# Patient Record
Sex: Female | Born: 1935 | Race: White | Hispanic: No | Smoking: Former smoker
Health system: Southern US, Community
[De-identification: ages and names within clinical notes are randomized; demographics above are authoritative.]

## PROBLEM LIST (undated history)

## (undated) DIAGNOSIS — K5010 Crohn's disease of large intestine without complications: Secondary | ICD-10-CM

## (undated) DIAGNOSIS — K219 Gastro-esophageal reflux disease without esophagitis: Secondary | ICD-10-CM

## (undated) DIAGNOSIS — D126 Benign neoplasm of colon, unspecified: Secondary | ICD-10-CM

## (undated) DIAGNOSIS — I10 Essential (primary) hypertension: Secondary | ICD-10-CM

## (undated) DIAGNOSIS — T7840XA Allergy, unspecified, initial encounter: Secondary | ICD-10-CM

## (undated) HISTORY — PX: VAGINAL HYSTERECTOMY: SUR661

## (undated) HISTORY — PX: APPENDECTOMY: SHX54

## (undated) HISTORY — PX: FOOT SURGERY: SHX648

## (undated) HISTORY — DX: Gastro-esophageal reflux disease without esophagitis: K21.9

## (undated) HISTORY — DX: Essential (primary) hypertension: I10

## (undated) HISTORY — DX: Benign neoplasm of colon, unspecified: D12.6

## (undated) HISTORY — PX: HAND SURGERY: SHX662

## (undated) HISTORY — DX: Allergy, unspecified, initial encounter: T78.40XA

## (undated) HISTORY — DX: Crohn's disease of large intestine without complications: K50.10

---

## 1998-01-28 ENCOUNTER — Ambulatory Visit (HOSPITAL_COMMUNITY): Admission: RE | Admit: 1998-01-28 | Discharge: 1998-01-28 | Payer: Self-pay

## 1998-02-03 ENCOUNTER — Ambulatory Visit (HOSPITAL_COMMUNITY): Admission: RE | Admit: 1998-02-03 | Discharge: 1998-02-03 | Payer: Self-pay

## 1999-03-06 DIAGNOSIS — D126 Benign neoplasm of colon, unspecified: Secondary | ICD-10-CM

## 1999-03-06 HISTORY — DX: Benign neoplasm of colon, unspecified: D12.6

## 1999-03-10 ENCOUNTER — Encounter (INDEPENDENT_AMBULATORY_CARE_PROVIDER_SITE_OTHER): Payer: Self-pay

## 1999-03-10 ENCOUNTER — Other Ambulatory Visit: Admission: RE | Admit: 1999-03-10 | Discharge: 1999-03-10 | Payer: Self-pay | Admitting: Gastroenterology

## 1999-03-10 ENCOUNTER — Encounter: Payer: Self-pay | Admitting: Gastroenterology

## 1999-03-20 ENCOUNTER — Ambulatory Visit (HOSPITAL_COMMUNITY): Admission: RE | Admit: 1999-03-20 | Discharge: 1999-03-20 | Payer: Self-pay | Admitting: Internal Medicine

## 1999-03-20 ENCOUNTER — Encounter (HOSPITAL_BASED_OUTPATIENT_CLINIC_OR_DEPARTMENT_OTHER): Payer: Self-pay | Admitting: Internal Medicine

## 1999-04-02 ENCOUNTER — Encounter: Payer: Self-pay | Admitting: General Surgery

## 1999-04-06 HISTORY — PX: HEMICOLECTOMY: SHX854

## 1999-04-06 HISTORY — PX: CHOLECYSTECTOMY: SHX55

## 1999-04-07 ENCOUNTER — Encounter: Payer: Self-pay | Admitting: Gastroenterology

## 1999-04-07 ENCOUNTER — Inpatient Hospital Stay (HOSPITAL_COMMUNITY): Admission: RE | Admit: 1999-04-07 | Discharge: 1999-04-14 | Payer: Self-pay | Admitting: General Surgery

## 1999-04-07 ENCOUNTER — Encounter (INDEPENDENT_AMBULATORY_CARE_PROVIDER_SITE_OTHER): Payer: Self-pay | Admitting: *Deleted

## 1999-04-13 ENCOUNTER — Encounter: Payer: Self-pay | Admitting: General Surgery

## 2000-07-07 ENCOUNTER — Ambulatory Visit (HOSPITAL_COMMUNITY): Admission: RE | Admit: 2000-07-07 | Discharge: 2000-07-07 | Payer: Self-pay | Admitting: Internal Medicine

## 2000-07-07 ENCOUNTER — Encounter (HOSPITAL_BASED_OUTPATIENT_CLINIC_OR_DEPARTMENT_OTHER): Payer: Self-pay | Admitting: Internal Medicine

## 2001-05-23 ENCOUNTER — Encounter: Payer: Self-pay | Admitting: Gastroenterology

## 2001-07-25 ENCOUNTER — Encounter (HOSPITAL_BASED_OUTPATIENT_CLINIC_OR_DEPARTMENT_OTHER): Payer: Self-pay | Admitting: Internal Medicine

## 2001-07-25 ENCOUNTER — Ambulatory Visit (HOSPITAL_COMMUNITY): Admission: RE | Admit: 2001-07-25 | Discharge: 2001-07-25 | Payer: Self-pay | Admitting: Internal Medicine

## 2002-07-27 ENCOUNTER — Encounter (HOSPITAL_BASED_OUTPATIENT_CLINIC_OR_DEPARTMENT_OTHER): Payer: Self-pay | Admitting: Internal Medicine

## 2002-07-27 ENCOUNTER — Ambulatory Visit (HOSPITAL_COMMUNITY): Admission: RE | Admit: 2002-07-27 | Discharge: 2002-07-27 | Payer: Self-pay | Admitting: Internal Medicine

## 2003-09-23 ENCOUNTER — Encounter: Payer: Self-pay | Admitting: Gastroenterology

## 2003-10-04 ENCOUNTER — Ambulatory Visit (HOSPITAL_COMMUNITY): Admission: RE | Admit: 2003-10-04 | Discharge: 2003-10-04 | Payer: Self-pay | Admitting: Internal Medicine

## 2005-01-25 ENCOUNTER — Ambulatory Visit (HOSPITAL_COMMUNITY): Admission: RE | Admit: 2005-01-25 | Discharge: 2005-01-25 | Payer: Self-pay | Admitting: Internal Medicine

## 2005-09-30 ENCOUNTER — Ambulatory Visit: Payer: Self-pay | Admitting: Gastroenterology

## 2005-10-11 ENCOUNTER — Ambulatory Visit: Payer: Self-pay | Admitting: Gastroenterology

## 2005-10-11 ENCOUNTER — Encounter (INDEPENDENT_AMBULATORY_CARE_PROVIDER_SITE_OTHER): Payer: Self-pay | Admitting: Specialist

## 2006-03-27 ENCOUNTER — Emergency Department (HOSPITAL_COMMUNITY): Admission: EM | Admit: 2006-03-27 | Discharge: 2006-03-27 | Payer: Self-pay | Admitting: Emergency Medicine

## 2006-03-31 ENCOUNTER — Ambulatory Visit: Payer: Self-pay | Admitting: Internal Medicine

## 2006-06-03 ENCOUNTER — Ambulatory Visit (HOSPITAL_COMMUNITY): Admission: RE | Admit: 2006-06-03 | Discharge: 2006-06-03 | Payer: Self-pay | Admitting: Internal Medicine

## 2007-04-18 ENCOUNTER — Encounter: Admission: RE | Admit: 2007-04-18 | Discharge: 2007-04-18 | Payer: Self-pay | Admitting: Internal Medicine

## 2007-04-21 ENCOUNTER — Ambulatory Visit: Payer: Self-pay | Admitting: Vascular Surgery

## 2007-08-15 ENCOUNTER — Ambulatory Visit (HOSPITAL_COMMUNITY): Admission: RE | Admit: 2007-08-15 | Discharge: 2007-08-15 | Payer: Self-pay | Admitting: Internal Medicine

## 2008-08-27 ENCOUNTER — Encounter (INDEPENDENT_AMBULATORY_CARE_PROVIDER_SITE_OTHER): Payer: Self-pay | Admitting: *Deleted

## 2008-09-05 ENCOUNTER — Ambulatory Visit (HOSPITAL_COMMUNITY): Admission: RE | Admit: 2008-09-05 | Discharge: 2008-09-05 | Payer: Self-pay | Admitting: Internal Medicine

## 2008-10-03 DIAGNOSIS — K5010 Crohn's disease of large intestine without complications: Secondary | ICD-10-CM

## 2008-10-03 HISTORY — DX: Crohn's disease of large intestine without complications: K50.10

## 2008-10-11 ENCOUNTER — Ambulatory Visit: Payer: Self-pay | Admitting: Gastroenterology

## 2008-10-22 ENCOUNTER — Ambulatory Visit: Payer: Self-pay | Admitting: Gastroenterology

## 2008-10-22 ENCOUNTER — Encounter: Payer: Self-pay | Admitting: Gastroenterology

## 2008-10-23 ENCOUNTER — Encounter: Payer: Self-pay | Admitting: Gastroenterology

## 2008-12-02 ENCOUNTER — Ambulatory Visit: Payer: Self-pay | Admitting: Gastroenterology

## 2008-12-02 DIAGNOSIS — Z860100 Personal history of colon polyps, unspecified: Secondary | ICD-10-CM | POA: Insufficient documentation

## 2008-12-02 DIAGNOSIS — K5010 Crohn's disease of large intestine without complications: Secondary | ICD-10-CM | POA: Insufficient documentation

## 2009-01-20 ENCOUNTER — Ambulatory Visit: Payer: Self-pay | Admitting: Gastroenterology

## 2009-06-16 ENCOUNTER — Encounter (INDEPENDENT_AMBULATORY_CARE_PROVIDER_SITE_OTHER): Payer: Self-pay | Admitting: *Deleted

## 2009-08-04 ENCOUNTER — Emergency Department (HOSPITAL_BASED_OUTPATIENT_CLINIC_OR_DEPARTMENT_OTHER): Admission: EM | Admit: 2009-08-04 | Discharge: 2009-08-04 | Payer: Self-pay | Admitting: Emergency Medicine

## 2009-08-04 ENCOUNTER — Ambulatory Visit: Payer: Self-pay | Admitting: Gastroenterology

## 2009-08-04 DIAGNOSIS — R141 Gas pain: Secondary | ICD-10-CM | POA: Insufficient documentation

## 2009-08-04 DIAGNOSIS — R143 Flatulence: Secondary | ICD-10-CM

## 2009-08-04 DIAGNOSIS — R142 Eructation: Secondary | ICD-10-CM

## 2010-05-05 NOTE — Assessment & Plan Note (Signed)
Summary: 55M FU/YF   History of Present Illness Visit Type: Follow-up Visit Primary GI MD: Sandra Igo MD [facility] Primary Provider: Leanna Battles, MD Requesting Provider: n/a Chief Complaint: F/u for colitis. Pt states that she has been doing better and denies any GI complaints. Pt stopped on her own taking Lialda and is doing fine and wants to know does she have to take the Lialda  History of Present Illness:   Sandra Yang returns for followup of Crohn's colitis. She has had complete resolution of her diarrhea. Her bloating and increased intestinal gas have persisted. About 2 weeks ago, she decided to discontinue Lialda and her diarrhea has not returned.   GI Review of Systems      Denies abdominal pain, acid reflux, belching, bloating, chest pain, dysphagia with liquids, dysphagia with solids, heartburn, loss of appetite, nausea, vomiting, vomiting blood, weight loss, and  weight gain.        Denies anal fissure, black tarry stools, change in bowel habit, constipation, diarrhea, diverticulosis, fecal incontinence, heme positive stool, hemorrhoids, irritable bowel syndrome, jaundice, light color stool, liver problems, rectal bleeding, and  rectal pain.   Current Medications (verified): 1)  Fenofibrate 160 Mg Tabs (Fenofibrate) .... Once Daily 2)  Hydrochlorothiazide 25 Mg Tabs (Hydrochlorothiazide) .... Once Daily 3)  Fish Oil 1200 Mg Caps (Omega-3 Fatty Acids) .... Once Daily 4)  Vitamin D 1000 Unit Tabs (Cholecalciferol) .... Once Daily 5)  Aspirin 81 Mg Tbec (Aspirin) .... Once Daily 6)  Prilosec Otc 20 Mg Tbec (Omeprazole Magnesium) .... Once Daily 7)  Allergy 4 Mg Tabs (Chlorpheniramine Maleate) .... Once Daily  Allergies (verified): 1)  ! Erythromycin  Past History:  Past Medical History: Reviewed history from 01/20/2009 and no changes required. 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 Cholecystectomy 2001 Right hemicolectomy 2001 for polyp Hysterectomy Appendectomy Foot surgery  Family History: Reviewed history from 01/20/2009 and no changes required. Family History of Prostate Cancer:father Family History of Breast Cancer:sister Bone Cancer: Father No FH of Colon Cancer:  Social History: Reviewed history from 12/02/2008 and no changes required. Occupation: Retired Patient is a former smoker.  Alcohol Use - yes Daily Caffeine Use Illicit Drug Use - no  Review of Systems       The pertinent positives and negatives are noted as above and in the HPI. All other ROS were reviewed and were negative.   Vital Signs:  Patient profile:   77 year old female Height:      60 inches Weight:      148 pounds BMI:     29.01 BSA:     1.64 Pulse rate:   88 / minute Pulse rhythm:   regular BP sitting:   120 / 68  (right arm) Cuff size:   regular  Vitals Entered By: [place] (Aug 04, 2009 11:02 AM)  Physical Exam  General:  Well developed, well nourished, no acute distress. Head:  Normocephalic and atraumatic. Eyes:  PERRLA, no icterus. Mouth:  No deformity or lesions, dentition normal. Lungs:  Clear throughout to auscultation. Heart:  Regular rate and rhythm; no murmurs, rubs,  or bruits. Abdomen:  Soft, nontender and nondistended. No masses, hepatosplenomegaly or hernias noted. Normal bowel sounds. Psych:  Alert and cooperative. Normal mood and affect.  Impression & Recommendations:  Problem # 1:  CROHN'S DISEASE-LARGE INTESTINE (ICD-555.1) Assessment Improved Presumed Crohn's colitis. Her diarrhea has resolved. She  would like to remain off medications since she is currently asymptomatic. I advised her on the high relapse rate of inflammatory bowel disease, and the concept of chemoprophylaxis wtih 5 ASA agents. She prefers to remain off medication unless she has recurrent problems with diarrhea.  Problem # 2:   PERSONAL HX COLONIC POLYPS (ICD-V12.72) Surveillance colonoscopy July 2013.  Problem # 3:  FLATULENCE-GAS-BLOATING (ICD-787.3) Begin a low gas diet. Gas-X q.i.d. p.r.n. Trial of Align for 1 to 2 months.  Patient Instructions: 1)  Excessive Gas Diet handout given.  2)  Start Align one tablet by mouth once daily. 3)  Call us back if your symptoms return.  4)  Copy sent to : Sandra Battles, MD 5)  The medication list was reviewed and reconciled.  All changed / newly prescribed medications were explained.  A complete medication list was provided to the patient / caregiver.

## 2010-05-05 NOTE — Letter (Signed)
Summary: Office Visit Letter  [facility]  [address] [place], [place] [address]   Phone: [phone]  Fax: [phone]      June 16, 2009 MRN: [id number]   [place], [place]  [id number]   Dear Ms. Artley,   According to our records, it is time for you to schedule a follow-up office visit with [place].   At your convenience, please call [phone] (option #2)to schedule an office visit. If you have any questions, concerns, or feel that this letter is in error, we would appreciate your call.   Sincerely,  Norberto Sorenson T. Fuller Plan, M.D.  [facility] Gastroenterology Division [phone]

## 2010-07-02 ENCOUNTER — Other Ambulatory Visit (HOSPITAL_BASED_OUTPATIENT_CLINIC_OR_DEPARTMENT_OTHER): Payer: Self-pay | Admitting: Internal Medicine

## 2010-07-02 DIAGNOSIS — Z1231 Encounter for screening mammogram for malignant neoplasm of breast: Secondary | ICD-10-CM

## 2010-07-13 ENCOUNTER — Ambulatory Visit (HOSPITAL_COMMUNITY)
Admission: RE | Admit: 2010-07-13 | Discharge: 2010-07-13 | Disposition: A | Discharge status: Home / Self Care | Payer: Medicare Other | Source: Ambulatory Visit | Attending: Internal Medicine | Admitting: Internal Medicine

## 2010-07-13 DIAGNOSIS — Z1231 Encounter for screening mammogram for malignant neoplasm of breast: Secondary | ICD-10-CM | POA: Insufficient documentation

## 2010-07-14 ENCOUNTER — Other Ambulatory Visit (HOSPITAL_BASED_OUTPATIENT_CLINIC_OR_DEPARTMENT_OTHER): Payer: Self-pay | Admitting: Internal Medicine

## 2010-07-14 DIAGNOSIS — N6310 Unspecified lump in the right breast, unspecified quadrant: Secondary | ICD-10-CM

## 2010-07-16 ENCOUNTER — Ambulatory Visit
Admission: RE | Admit: 2010-07-16 | Discharge: 2010-07-16 | Disposition: A | Discharge status: Home / Self Care | Payer: Medicare Other | Source: Ambulatory Visit | Attending: Internal Medicine | Admitting: Internal Medicine

## 2010-07-16 DIAGNOSIS — N6310 Unspecified lump in the right breast, unspecified quadrant: Secondary | ICD-10-CM

## 2010-08-18 NOTE — Procedures (Signed)
DUPLEX DEEP VENOUS EXAM - LOWER EXTREMITY   INDICATION:  Left foot pain and edema.   HISTORY:  Edema:  Several weeks of left foot edema.  Trauma/Surgery:  Right foot surgery, June, 2008 for bunion and  hammertoe.  Pain:  Several weeks of left foot, hip, and back pain.  PE:  No.  Previous DVT:  No.  Anticoagulants:  Aspirin daily.  Other:  Patient has several recent long car trips.   DUPLEX EXAM:                CFV   SFV   PopV  PTV    GSV                R  L  R  L  R  L  R   L  R  L  Thrombosis    o  o     o     o      o     o  Spontaneous   +  +     +     +      +     +  Phasic        +  +     +     +      +     +  Augmentation  +  +     +     +      +     +  Compressible  +  +     +     +      +     +  Competent     +  +     +     +      +     +   Legend:  + - yes  o - no  p - partial  D - decreased   IMPRESSION:  1. No evidence of left leg deep or superficial venous thrombosis.  2. No evidence of baker's cyst in the left leg.  3. No evidence of significant venous reflux in the left leg.    _____________________________  Nelda Severe. Kellie Simmering, M.D.   MC/MEDQ  D:  04/21/2007  T:  04/21/2007  Job:  [id number]

## 2010-08-21 NOTE — Procedures (Signed)
[place]. [facility]  Patient:    Sandra Yang                      MRN: [id number] Proc. Date: 04/07/99 Adm. Date:  [id number] Attending:  Erick Blinks CC:         Ala Dach, M.D.                           Procedure Report  OPERATION PERFORMED:  Laparotomy for right colectomy for colon neoplasm performed by Dr. Jackolyn Confer.  ANESTHESIA PROCEDURE:  Placement of lumbar epidural catheter for postoperative analgesia.  ANESTHESIOLOGIST: Ala Dach, M.D.  NOTE:  Preoperatively the risks and benefits of placement of the epidural catheter for postoperative analgesia were discussed in detail with the patient.  The patient consented to placement of the epidural for postoperative analgesia.  DESCRIPTION OF PROCEDURE: At the end of the operative procedure the patient was  turned into the left lateral decubitus position and a sterile prep of the lumbar area was conducted.  Using a #17 gauge Tuohy needle adjacent to the L2-3 interspace and with a perimedian approach the epidural space was contacted with the loss of resistance technique.  The catheter was then threaded with ease approximately 2-3 cm beyond the needle tip and the needle was removed.  The catheter was initially injected with a cc of Normal Saline, then aspirated and noticed a small amount of blood-tinged Normal  Saline on return, but continued aspiration was without any additional return of  fluid or any cerebrospinal fluid.  The catheter was then injected with a total f 6 cc of 0.25% Marcaine, which also contained 25 mcg of ______ fentanyl.  The catheter was secured in place with tape.  The patient was turned  supine, extubated and transferred to the [place] [facility] in stable condition.  DISPOSITION:  The patient will be followed daily by the Department of Anesthesiology for postoperative analgesia. DD:  04/07/99 TD:  04/07/99 Job:  [id number]

## 2010-08-21 NOTE — Assessment & Plan Note (Signed)
[place] OFFICE NOTE   Sandra, Yang                     MRN:          [id number]  DATE:03/31/2006                            DOB:          07-25-1933    Sandra Yang is a 77 year old white female patient of Dr. Leanna Yang,  seen yesterday as an acute walk-in by Dr. Forde Yang, because of acute lower  abdominal pain and diarrhea illness which started about 8 days ago.  She  is Dr. Lynne Yang GI patient who underwent a right hemicolectomy for  adenomatous polyp of the right colon in 2001.  Last colonoscopy in July  2007 showed colon polyps.  Next colonoscopy scheduled in 3 years.  [name] has been doing well until 1 week ago when she suddenly developed  crampy lower abdominal pain and diarrhea.  She denies passing any blood,  but her abdomen became quite distended and bloated, and this persisted  for about 5 days.  She is only, slowly, now getting better.  Stools are  soft.  There has been low-grade temperature.  She is about 50% better.  She was started on Flagyl last week, but because of nausea had to  discontinue it.  She has been treated for hypertension with  hydrochlorothiazide 25 mg a day.  She does not own a blood pressure  cuff, and, therefore, does not know what her blood pressure runs at  home.   MEDICATIONS:  1. Claritin.  2. Aspirin 81 mg p.o. daily.  3. Hydrochlorothiazide 25 mg p.o. daily.  4. Triglide 1 daily.   PHYSICAL EXAMINATION:  Blood pressure 108/60, pulse 80, and weight not  taken.  She was alert and oriented in no distress.  COR:  Quiet precordium, normal S1, normal S2.  LUNGS:  Clear to auscultation.  ABDOMEN:  Slightly protuberant, soft, with tenderness throughout the  right and left colon, more so in the left upper and left middle  quadrants.  There was no rebound, no palpable mass.  RECTAL:  Shows normal exit tone with soft, yellow, hemoccult-negative  stool.   CT  scan of the abdomen done on December 24 showed thickening of the  entire colon, mostly in the left colon comparable with segmental  colitis.   IMPRESSION:  A 77 year old white female with acute colitis, most likely  ischemic, most likely related to low flow state, either due to diarrhea  induced hypovolemia, or possibly contributed by antihypertensive  medication.  Her blood pressure remains low, but she is recovering  nicely.   Yang:  1. I have assured patient that there will be a complete resolution of      her symptoms within the next week or two without [place] having to do      any further testing.  2.  Stay on low-residue diet for at least a      week.  2. I advised patient to purchase a blood pressure cuff and check her      blood pressure every morning.  She will hold her      hydrochlorothiazide for next 2 weeks until she sees  Dr. Forde Yang or      Dr. Philip Yang.  I advised that she holds her hydrochlorothiazide if      her blood pressure      systolic is less than 886 on her home check.  She is going to      follow up with Dr. Fuller Yang for her usual GI care.     Sandra Yang. Olevia Perches, MD  Electronically Signed    Sandra Yang  DD: 03/31/2006  DT: 03/31/2006  Job #: [id number]   cc:   Sandra Yang. Sandra Plan, MD, Sandra Yang Sandra Yang, M.D.  Sandra Yang. Sandra Yang, M.D.

## 2010-08-21 NOTE — Discharge Summary (Signed)
[place]. [facility]  Patient:    Sandra Yang                      MRN: [id number] Adm. Date:  [id number] Disc. Date: 04/14/99 Attending:  Erick Blinks CC:         Bevelyn Buckles, M.D.             Pricilla Riffle. Dagoberto Ligas., M.D. [facility]             Bryson Dames, M.D.                           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  SECONDARY DIAGNOSES: 1. Cholelithiasis. 2. Pulmonary edema. 3. Hypokalemia.  PROCEDURE:  Right colectomy with distal ileal resection and cholecystectomy April 07, 1999.  REASON FOR ADMISSION:  This is a 76 year old female with a history of intermittent rectal bleeding.  She underwent colonoscopy and was found to have a 4-5 cm cyst, multilobulated mass, in the ascending colon.  It could not be completely removed colonoscopically.  Because of its size, she had increased risk for malignancy and was admitted for elective resection.  HOSPITAL COURSE:  She underwent the above procedure without complications. Postoperatively, her pain control was adequate with epidural analgesia.  She did have a mild ileus.  On her sixth postoperative day, she developed some increasing dyspnea.  Physical exam and chest x-ray were consistent with some pulmonary edema and small pleural effusions.  Aggressive diuresis was performed.  She was also noted to be hypokalemic and she was given oral potassium supplements.  Within 24 hours, she was much improved.  The lower extremity edema was decreased.  She had no evidence of pulmonary edema on physical exam and she was able to be discharged.   DISPOSITION:  Discharged to home on April 14, 1999 in satisfactory condition.  DISCHARGE INSTRUCTIONS:  Discharge instructions and an instruction sheet were given to her.  DISCHARGE MEDICATIONS:  To include those she was taking at home, as well as Percocet for  pain.  FOLLOW-UP:  She may follow up in two weeks. DD:  04/14/99 TD:  04/14/99 Job: [id number]

## 2010-11-11 ENCOUNTER — Other Ambulatory Visit: Payer: Self-pay | Admitting: Gastroenterology

## 2010-11-18 ENCOUNTER — Other Ambulatory Visit: Payer: Self-pay | Admitting: Gastroenterology

## 2010-11-19 MED ORDER — MESALAMINE 1.2 G PO TBEC: 2400.0000 mg | DELAYED_RELEASE_TABLET | Freq: Every day | ORAL | Status: DC

## 2010-11-19 NOTE — Telephone Encounter (Signed)
Patient states she started back on Lialda 2 tablets by mouth once daily 2-3 months ago since she had left over prescription from before. She states she realized her symptoms were much better when she was taking something for her Crohn's disease. I told her that she needs to make sure she contacts [place] when there is changes to her symptoms or medicines with her disease. I informed patient that she needs a office visit before we can give her more refills on her Lialda. Pt will come in and see Dr. Fuller Plan 11/25/10. Pt agreed and will come get samples of Lialda for her to take until her appt next week.

## 2010-11-25 ENCOUNTER — Encounter: Payer: Self-pay | Admitting: Gastroenterology

## 2010-11-25 ENCOUNTER — Ambulatory Visit (INDEPENDENT_AMBULATORY_CARE_PROVIDER_SITE_OTHER): Payer: Medicare Other | Admitting: Gastroenterology

## 2010-11-25 VITALS — BP 110/60 | HR 64 | Ht 61.0 in | Wt 137.4 lb

## 2010-11-25 DIAGNOSIS — K219 Gastro-esophageal reflux disease without esophagitis: Secondary | ICD-10-CM

## 2010-11-25 DIAGNOSIS — K5010 Crohn's disease of large intestine without complications: Secondary | ICD-10-CM

## 2010-11-25 DIAGNOSIS — Z86010 Personal history of colonic polyps: Secondary | ICD-10-CM

## 2010-11-25 MED ORDER — MESALAMINE 1.2 G PO TBEC: 2400.0000 mg | DELAYED_RELEASE_TABLET | Freq: Every day | ORAL | Status: DC

## 2010-11-25 NOTE — Progress Notes (Signed)
History of Present Illness: This is a 77 year old female who returns for followup of Crohn's colitis and GERD. She states she has mild intermittent diarrhea but her colitis is well controlled. Her reflux symptoms are under excellent control on omeprazole.  Current Medications, Allergies, Past Medical History, Past Surgical History, Family History and Social History were reviewed in [employer] record.  Physical Exam: General: Well developed , well nourished, no acute distress Head: Normocephalic and atraumatic Eyes:  sclerae anicteric, EOMI Ears: Normal auditory acuity Mouth: No deformity or lesions Lungs: Clear throughout to auscultation Heart: Regular rate and rhythm; no murmurs, rubs or bruits Abdomen: Soft, non tender and non distended. No masses, hepatosplenomegaly or hernias noted. Normal Bowel sounds Musculoskeletal: Symmetrical with no gross deformities  Pulses:  Normal pulses noted Extremities: No clubbing, cyanosis, edema or deformities noted Neurological: Alert oriented x 4, grossly nonfocal Psychological:  Alert and cooperative. Normal mood and affect  Assessment and Recommendations:  1. Crohn's colitis. Continue Lialda 2.4 g daily.  2. GERD. Continue omeprazole 20 mg daily and standard antireflux measures.  3. Personal history of a tubulovillous adenoma in 2000 requiring a right hemicolectomy. Surveillance colonoscopy recommended July 2013.

## 2010-11-25 NOTE — Patient Instructions (Addendum)
A refill for your prescription has been sent to your pharmacy.  cc: Leanna Battles, MD

## 2010-12-09 ENCOUNTER — Ambulatory Visit: Payer: Medicare Other | Admitting: Gastroenterology

## 2010-12-25 ENCOUNTER — Telehealth: Payer: Self-pay | Admitting: Gastroenterology

## 2010-12-25 MED ORDER — HYOSCYAMINE SULFATE 0.125 MG SL SUBL: SUBLINGUAL_TABLET | SUBLINGUAL | Status: DC

## 2010-12-25 NOTE — Telephone Encounter (Signed)
Patient is c/o urgency and occasional loose stool 1-3 times a week.  She has a history of crohn's colitis.  She is taking her lialda.  She denies fever, nausea or vomiting, or bleeding.  Discussed with Dr Fuller Plan start Levsin 1-2 qid PRN abdominal pain and urgency.

## 2011-04-19 ENCOUNTER — Telehealth: Payer: Self-pay | Admitting: Gastroenterology

## 2011-04-19 NOTE — Telephone Encounter (Signed)
Agree with plan 

## 2011-04-19 NOTE — Telephone Encounter (Signed)
Patient c/o vomiting that started at Saturday night and then she developed diarrhea. She reports some urgency and diarrhea but no further vomiting.  Patient with a history of crohn's .  She is still taking her Lialda. She denies N&V at present, rectal bleeding or other GI complaints.  She will come in in the am and see Tye Savoy RNP at 9:30 to eval stomach virus vs crohn's flare.  She is asked to take levsin as previously ordered for her cramping

## 2011-04-20 ENCOUNTER — Other Ambulatory Visit (INDEPENDENT_AMBULATORY_CARE_PROVIDER_SITE_OTHER): Payer: Medicare Other

## 2011-04-20 ENCOUNTER — Ambulatory Visit (INDEPENDENT_AMBULATORY_CARE_PROVIDER_SITE_OTHER): Payer: Medicare Other | Admitting: Nurse Practitioner

## 2011-04-20 ENCOUNTER — Other Ambulatory Visit: Payer: Medicare Other

## 2011-04-20 ENCOUNTER — Encounter: Payer: Self-pay | Admitting: Nurse Practitioner

## 2011-04-20 VITALS — BP 138/78 | HR 100 | Ht 60.0 in | Wt 132.6 lb

## 2011-04-20 DIAGNOSIS — R197 Diarrhea, unspecified: Secondary | ICD-10-CM

## 2011-04-20 DIAGNOSIS — K5010 Crohn's disease of large intestine without complications: Secondary | ICD-10-CM

## 2011-04-20 LAB — COMPREHENSIVE METABOLIC PANEL
ALT: 38 U/L — ABNORMAL HIGH (ref 0–35)
Albumin: 4.6 g/dL (ref 3.5–5.2)
Alkaline Phosphatase: 58 U/L (ref 39–117)
Glucose, Bld: 107 mg/dL — ABNORMAL HIGH (ref 70–99)
Potassium: 3.7 mEq/L (ref 3.5–5.1)
Sodium: 137 mEq/L (ref 135–145)
Total Bilirubin: 1.3 mg/dL — ABNORMAL HIGH (ref 0.3–1.2)
Total Protein: 8.5 g/dL — ABNORMAL HIGH (ref 6.0–8.3)

## 2011-04-20 LAB — CBC WITH DIFFERENTIAL/PLATELET
Basophils Absolute: 0 10*3/uL (ref 0.0–0.1)
Eosinophils Relative: 0.1 % (ref 0.0–5.0)
MCV: 89.9 fl (ref 78.0–100.0)
Monocytes Absolute: 0.4 10*3/uL (ref 0.1–1.0)
Monocytes Relative: 9.7 % (ref 3.0–12.0)
Neutrophils Relative %: 69.1 % (ref 43.0–77.0)
Platelets: 229 10*3/uL (ref 150.0–400.0)
RDW: 12.8 % (ref 11.5–14.6)
WBC: 4.2 10*3/uL — ABNORMAL LOW (ref 4.5–10.5)

## 2011-04-20 LAB — SEDIMENTATION RATE: Sed Rate: 14 mm/hr (ref 0–22)

## 2011-04-20 NOTE — Progress Notes (Signed)
Sandra Yang [id number] Aug 06, 1933   HISTORY OR PRESENT ILLNESS :  Patient is a 78 year old female followed by Dr. Fuller Plan for history of GERD, Crohn's colitis, tubulovillous adenomatous colon polyps requiring a right hemicolectomy year 2000. She was last seen in August 2012 at which time she seemed to be doing well. Saturday night she began having nausea and vomiting, though the vomiting has subsided she still having waves of nausea. She is also having diarrhea and stomach cramps. No sick contact attacks, no recent antibiotics at service on August morning she's had about ten episodes of loose stool. Stool is not non-bloody, not malodorous.Her abdominal cramps are diffuse. Patient feels similar to when she was diagnosed with Crohn's colitis in 2010.   Current Medications, Allergies, Past Medical History, Past Surgical History, Family History and Social History were reviewed in [employer] record.  PHYSICAL EXAMINATION : General: Well developed  female in no acute distress Head: Normocephalic and atraumatic Eyes:  sclerae anicteric,conjunctive pink. Ears: Normal auditory acuity Neck: Supple, no masses.  Lungs: Clear throughout to auscultation Heart: Regular rate and rhythm Abdomen: Soft, nondistended, nontender. No masses or hepatomegaly noted. Normal bowel sounds Rectal: not done Musculoskeletal: Symmetrical with no gross deformities  Skin: No lesions on visible extremities Extremities: No edema or deformities noted Neurological: Oriented, grossly nonfocal Cervical Nodes:  No significant cervical adenopathy Psychological:  Alert and cooperative. Normal mood and affect  ASSESSMENT AND PLAN :  1.  Acute nausea vomiting diarrhea and diffuse abdominal pain. Rule out infectious colitis. Rule out Crohn's flare. Will check stool studies, basic labs. Depending on stool studies she may need antibiotics. If stool negative she will likely need a course of steroids for  possible Crohn's flare. Levsin  helps her cramps so she can continue to take as needed. Patient looks okay, her abdominal exam is not concerning. She can take Immodium at bedtime to avoid sleep disruption until we can figure out what is going on. Patient is drinking plenty of fluids to avoid dehydration. We will call her tomorrow for condition update and further recommendations.  2. Crohn's colitis, maintained on Lialda. See #1.

## 2011-04-20 NOTE — Patient Instructions (Signed)
Please go to the basement level to have your labs drawn.   Continue plenty of fluids. We will call you with the results. Take Imodium twice daily as needed for diarrhea. We made you a follow up appointment with Dr. Fuller Plan on 05-11-2011. Appointment card given.

## 2011-04-22 NOTE — Progress Notes (Signed)
i agree with the plan outlined in this note.

## 2011-04-24 LAB — STOOL CULTURE

## 2011-05-11 ENCOUNTER — Encounter: Payer: Self-pay | Admitting: Gastroenterology

## 2011-05-11 ENCOUNTER — Ambulatory Visit (INDEPENDENT_AMBULATORY_CARE_PROVIDER_SITE_OTHER): Payer: Medicare Other | Admitting: Gastroenterology

## 2011-05-11 VITALS — BP 130/70 | HR 64 | Ht 60.0 in | Wt 136.4 lb

## 2011-05-11 DIAGNOSIS — K5010 Crohn's disease of large intestine without complications: Secondary | ICD-10-CM

## 2011-05-11 DIAGNOSIS — K219 Gastro-esophageal reflux disease without esophagitis: Secondary | ICD-10-CM

## 2011-05-11 NOTE — Progress Notes (Signed)
History of Present Illness: This is a 78 year old woman returning for followup of an acute self-limited illness with nausea vomiting diarrhea and abdominal pain. Her symptoms resolved after 3 or 4 days and her normal bowel pattern of one semi-formed stool per day has returned. Denies weight loss, abdominal pain, constipation, diarrhea, change in stool caliber, melena, hematochezia, nausea, vomiting, dysphagia, reflux symptoms, chest pain.  Current Medications, Allergies, Past Medical History, Past Surgical History, Family History and Social History were reviewed in [employer] record.  Physical Exam: General: Well developed , well nourished, no acute distress Head: Normocephalic and atraumatic Eyes:  sclerae anicteric, EOMI Ears: Normal auditory acuity Mouth: No deformity or lesions Lungs: Clear throughout to auscultation Heart: Regular rate and rhythm; no murmurs, rubs or bruits Abdomen: Soft, non tender and non distended. No masses, hepatosplenomegaly or hernias noted. Normal Bowel sounds Musculoskeletal: Symmetrical with no gross deformities  Extremities: No clubbing, cyanosis, edema or deformities noted Neurological: Alert oriented x 4, grossly nonfocal Psychological:  Alert and cooperative. Normal mood and affect  Assessment and Recommendations:  1. Self-limited gastroenteritis. Resolved.  2. Crohn's colitis. Continue Lialda 2.4 g daily.  3. Personal history of tubulovillous adenomatous colon polyp. A 3 year surveillance colonoscopy is recommended for July 2013.  4. GERD. Well controlled on daily omeprazole and antireflux measures.

## 2011-05-11 NOTE — Patient Instructions (Signed)
Your Recall Colonoscopy is in July 2013.  Follow up in one year.  Continue current medications. cc: Leanna Battles, MD

## 2011-10-12 ENCOUNTER — Telehealth: Payer: Self-pay | Admitting: Gastroenterology

## 2011-10-12 MED ORDER — DIPHENOXYLATE-ATROPINE 2.5-0.025 MG PO TABS: 1.0000 | ORAL_TABLET | Freq: Two times a day (BID) | ORAL | Status: DC | PRN

## 2011-10-12 NOTE — Telephone Encounter (Signed)
OK for lomotil for 5-7 days as needed Low fat, lactose free, low residue, caffeine free diet until symptoms resolve. Push PO fluids. Will need medical evaluation in [facility] or here when she returns if her symptoms do not resolve

## 2011-10-12 NOTE — Telephone Encounter (Signed)
I have called in the rx lomotil 1 po bid prn #14 with zero refills.   I have spoken with the patient's daughter she verbalized understanding of the instructions.  She will have her mother make an appt with Dr. Fuller Plan  [place] she returns from the beach

## 2011-10-12 NOTE — Telephone Encounter (Signed)
Patient reports 2 week history of  8-10 liquid BM a day.  She c/o diarrhea and urgency.  She denies cramping, rectal bleeding, nausea or vomiting,  and fever.  She is at [facility].  She has tried imodium for the diarrhea and it is minimally helping.  Dr. Fuller Plan she is requesting a rx for lomotil or other tx.  She is taking lialda 2.4 gm a day

## 2011-11-01 ENCOUNTER — Other Ambulatory Visit (HOSPITAL_COMMUNITY): Payer: Self-pay | Admitting: Internal Medicine

## 2011-11-01 DIAGNOSIS — Z1231 Encounter for screening mammogram for malignant neoplasm of breast: Secondary | ICD-10-CM

## 2011-11-08 ENCOUNTER — Encounter: Payer: Self-pay | Admitting: Gastroenterology

## 2011-11-19 ENCOUNTER — Ambulatory Visit (HOSPITAL_COMMUNITY)
Admission: RE | Admit: 2011-11-19 | Discharge: 2011-11-19 | Disposition: A | Discharge status: Home / Self Care | Payer: Medicare Other | Source: Ambulatory Visit | Attending: Internal Medicine | Admitting: Internal Medicine

## 2011-11-19 DIAGNOSIS — Z1231 Encounter for screening mammogram for malignant neoplasm of breast: Secondary | ICD-10-CM | POA: Insufficient documentation

## 2011-11-24 ENCOUNTER — Other Ambulatory Visit: Payer: Self-pay | Admitting: Gastroenterology

## 2011-11-24 NOTE — Telephone Encounter (Signed)
NEEDS PROCEDURE, OVER DUE FOR COLONOSCOPY

## 2011-12-09 ENCOUNTER — Ambulatory Visit (AMBULATORY_SURGERY_CENTER): Payer: Medicare Other | Admitting: *Deleted

## 2011-12-09 VITALS — Ht 61.0 in | Wt 140.2 lb

## 2011-12-09 DIAGNOSIS — Z1211 Encounter for screening for malignant neoplasm of colon: Secondary | ICD-10-CM

## 2011-12-09 MED ORDER — MOVIPREP 100 G PO SOLR: ORAL | Status: DC

## 2011-12-13 ENCOUNTER — Encounter: Payer: Self-pay | Admitting: Gastroenterology

## 2011-12-21 ENCOUNTER — Encounter: Payer: Self-pay | Admitting: Gastroenterology

## 2011-12-21 ENCOUNTER — Ambulatory Visit (AMBULATORY_SURGERY_CENTER): Payer: Medicare Other | Admitting: Gastroenterology

## 2011-12-21 VITALS — BP 145/63 | HR 68 | Temp 97.8°F | Resp 14 | Ht 60.0 in | Wt 136.0 lb

## 2011-12-21 DIAGNOSIS — D126 Benign neoplasm of colon, unspecified: Secondary | ICD-10-CM

## 2011-12-21 DIAGNOSIS — Z860100 Personal history of colon polyps, unspecified: Secondary | ICD-10-CM

## 2011-12-21 DIAGNOSIS — Z1211 Encounter for screening for malignant neoplasm of colon: Secondary | ICD-10-CM

## 2011-12-21 DIAGNOSIS — K5090 Crohn's disease, unspecified, without complications: Secondary | ICD-10-CM

## 2011-12-21 DIAGNOSIS — D1330 Benign neoplasm of unspecified part of small intestine: Secondary | ICD-10-CM

## 2011-12-21 DIAGNOSIS — K5010 Crohn's disease of large intestine without complications: Secondary | ICD-10-CM

## 2011-12-21 MED ORDER — SODIUM CHLORIDE 0.9 % IV SOLN: 500.0000 mL | INTRAVENOUS | Status: DC

## 2011-12-21 NOTE — Op Note (Signed)
[place]  [address] [place], [address]   COLONOSCOPY PROCEDURE REPORT  PATIENT: Sandra, Yang  MR#: [id number] BIRTHDATE: Feb 04, 1934 , 78  yrs. 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 MEDICATIONS: MAC sedation, administered by CRNA and propofol (Diprivan) 360m IV  DESCRIPTION OF PROCEDURE:   After the risks benefits and alternatives of the procedure were thoroughly explained, informed consent was obtained.  A digital rectal exam revealed no abnormalities of the rectum.   The LB PCF-H180AL [id number] endoscope was introduced through the anus and advanced to the terminal ileum which was intubated for a short distance. No adverse events experienced.   The quality of the prep was excellent, using MoviPrep  The instrument was then slowly withdrawn as the colon was fully examined.   COLON FINDINGS: The mucosa appeared normal in the neoterminal ileum. Multiple random biopsies of the area were performed.   Prior right hemicolectomy noted. The colon was otherwise normal.  There was no diverticulosis, inflammation, polyps or cancers unless previously stated. Multiple random biopsies of the colon were performed. Retroflexed views revealed hypertrophied anal papillae. The time to cecum=1 minutes 19 seconds.  Withdrawal time=7 minutes 37 seconds. The scope was withdrawn and the procedure completed. COMPLICATIONS: There were no complications.  ENDOSCOPIC IMPRESSION: 1.   Normal mucosa in the neoterminal ileum and colon; multiple random biopsies of the area were performed 2.   Hypertrophied anal papillae 3.   Prior right hemicolectomy  RECOMMENDATIONS: 1.  await pathology results 2.  Out patient follow-up in 3-4 weeks. 3.  repeat  Colonoscopy in 3 years.   eSigned:  MLadene Artist MD, [facility]09/17/2013 11:03 AM   cc: DLeanna Battles MD   PATIENT NAME:  [name], BrierleyMR#: [id number]

## 2011-12-21 NOTE — Patient Instructions (Addendum)
YOU HAD AN ENDOSCOPIC PROCEDURE TODAY AT [facility]: Refer to the procedure report that was given to you for any specific questions about what was found during the examination.  If the procedure report does not answer your questions, please call your gastroenterologist to clarify.  If you requested that your care partner not be given the details of your procedure findings, then the procedure report has been included in a sealed envelope for you to review at your convenience later.  YOU SHOULD EXPECT: Some feelings of bloating in the abdomen. Passage of more gas than usual.  Walking can help get rid of the air that was put into your GI tract during the procedure and reduce the bloating. If you had a lower endoscopy (such as a colonoscopy or flexible sigmoidoscopy) you may notice spotting of blood in your stool or on the toilet paper. If you underwent a bowel prep for your procedure, then you may not have a normal bowel movement for a few days.  DIET: Your first meal following the procedure should be a light meal and then it is ok to progress to your normal diet.  A half-sandwich or bowl of soup is an example of a good first meal.  Heavy or fried foods are harder to digest and may make you feel nauseous or bloated.  Likewise meals heavy in dairy and vegetables can cause extra gas to form and this can also increase the bloating.  Drink plenty of fluids but you should avoid alcoholic beverages for 24 hours.  ACTIVITY: Your care partner should take you home directly after the procedure.  You should plan to take it easy, moving slowly for the rest of the day.  You can resume normal activity the day after the procedure however you should NOT DRIVE or use heavy machinery for 24 hours (because of the sedation medicines used during the test).    SYMPTOMS TO REPORT IMMEDIATELY: A gastroenterologist can be reached at any hour.  During normal business hours, 8:30 AM to 5:00 PM Monday through Friday,  call [phone].  After hours and on weekends, please call the GI answering service at [phone] who will take a message and have the physician on call contact you.   Following lower endoscopy (colonoscopy or flexible sigmoidoscopy):  Excessive amounts of blood in the stool  Significant tenderness or worsening of abdominal pains  Swelling of the abdomen that is new, acute  Fever of 100F or higher  Following upper endoscopy (EGD)  Vomiting of blood or coffee ground material  New chest pain or pain under the shoulder blades  Painful or persistently difficult swallowing  New shortness of breath  Fever of 100F or higher  Black, tarry-looking stools  FOLLOW UP: If any biopsies were taken you will be contacted by phone or by letter within the next 1-3 weeks.  Call your gastroenterologist if you have not heard about the biopsies in 3 weeks.  Our staff will call the home number listed on your records the next business day following your procedure to check on you and address any questions or concerns that you may have at that time regarding the information given to you following your procedure. This is a courtesy call and so if there is no answer at the home number and we have not heard from you through the emergency physician on call, we will assume that you have returned to your regular daily activities without incident.  SIGNATURES/CONFIDENTIALITY: You and/or your care  partner have signed paperwork which will be entered into your electronic medical record.  These signatures attest to the fact that that the information above on your After Visit Summary has been reviewed and is understood.  Full responsibility of the confidentiality of this discharge information lies with you and/or your care-partner.   REPEAT COLONOSCOPY IN 3 YEARS  PLEASE CALL DR Lynne Leader OFFICE AT [phone] TO SCHEDULE AN APPOINTMENT TO SEE HIM IN 3-4 WEEKS.

## 2011-12-21 NOTE — Progress Notes (Signed)
The pt tolerated the colonoscopy very well. maw

## 2011-12-21 NOTE — Progress Notes (Signed)
Patient did not experience any of the following events: a burn prior to discharge; a fall within the facility; wrong site/side/patient/procedure/implant event; or a hospital transfer or hospital admission upon discharge from the facility. (G8907) Patient did not have preoperative order for IV antibiotic SSI prophylaxis. (G8918)  

## 2011-12-22 ENCOUNTER — Telehealth: Payer: Self-pay

## 2011-12-22 NOTE — Telephone Encounter (Signed)
  Follow up Call-  Call back number 12/21/2011  Post procedure Call Back phone  # [phone]     Patient questions:  Do you have a fever, pain , or abdominal swelling? no Pain Score  0 *  Have you tolerated food without any problems? yes  Have you been able to return to your normal activities? yes  Do you have any questions about your discharge instructions: Diet   no Medications  no Follow up visit  no  Do you have questions or concerns about your Care? no  Actions: * If pain score is 4 or above: No action needed, pain <4.  Per the pt she did feel bloated last night and still a little this am.  No abdominal pain noted.  I encouraged the pt to continue to pass the gas ans call us back if it does not resolve today. Maw

## 2011-12-24 ENCOUNTER — Telehealth: Payer: Self-pay | Admitting: Gastroenterology

## 2011-12-24 NOTE — Telephone Encounter (Signed)
Left message for patient to call back  

## 2011-12-24 NOTE — Telephone Encounter (Signed)
Patient reports urgent diarrhea, 7-8 episodes before lunch yesterday. Her symptoms are worse in the am.  She denies rectal bleeding , abdominal pain, or other complaints.  Random colon bx were taken during her colonoscopy on 12/22/11.  She is currently taking lialda 2 daily.  She is advised to use imodium daily.  Dr. Fuller Plan she is going out of town next week and is requesting a refill of lomotil.  Please advise next step

## 2011-12-24 NOTE — Telephone Encounter (Signed)
OK to refill lomotil Await biopsies, hopefully they will be ready by Tuesday

## 2011-12-27 ENCOUNTER — Encounter: Payer: Self-pay | Admitting: Gastroenterology

## 2011-12-27 ENCOUNTER — Other Ambulatory Visit: Payer: Self-pay

## 2011-12-27 MED ORDER — DIPHENOXYLATE-ATROPINE 2.5-0.025 MG PO TABS: 1.0000 | ORAL_TABLET | Freq: Two times a day (BID) | ORAL | Status: DC | PRN

## 2011-12-27 NOTE — Telephone Encounter (Signed)
Patient advised.  Rx sent.  She is aware we will notify her when the path results are available

## 2012-01-13 ENCOUNTER — Other Ambulatory Visit: Payer: Self-pay | Admitting: Gastroenterology

## 2012-01-17 ENCOUNTER — Other Ambulatory Visit: Payer: Self-pay | Admitting: Gastroenterology

## 2012-01-18 MED ORDER — MESALAMINE 1.2 G PO TBEC: DELAYED_RELEASE_TABLET | ORAL | Status: DC

## 2012-01-18 NOTE — Telephone Encounter (Signed)
Spoke with patient and she has never been on Amitiza and is not currently taking that medication. Asked patient if the medication was called Lialda. Pt states that it is and she wants to know if she can have samples or a refill until her next office visit. Patient has to reschedule office visit due to work issues. Told patient I will send in one more month of Lialda to her pharmacy but we do not have samples of Lialda currently. Pt agreed and will go by her pharmacy.

## 2012-01-19 ENCOUNTER — Ambulatory Visit: Payer: Medicare Other | Admitting: Gastroenterology

## 2012-01-25 ENCOUNTER — Ambulatory Visit: Payer: Medicare Other | Admitting: Gastroenterology

## 2012-02-07 ENCOUNTER — Ambulatory Visit (INDEPENDENT_AMBULATORY_CARE_PROVIDER_SITE_OTHER): Payer: Medicare Other | Admitting: Gastroenterology

## 2012-02-07 ENCOUNTER — Encounter: Payer: Self-pay | Admitting: Gastroenterology

## 2012-02-07 VITALS — BP 124/66 | HR 76 | Ht 60.0 in | Wt 139.0 lb

## 2012-02-07 DIAGNOSIS — K589 Irritable bowel syndrome without diarrhea: Secondary | ICD-10-CM

## 2012-02-07 DIAGNOSIS — K5010 Crohn's disease of large intestine without complications: Secondary | ICD-10-CM

## 2012-02-07 MED ORDER — MESALAMINE 1.2 G PO TBEC: DELAYED_RELEASE_TABLET | ORAL | Status: DC

## 2012-02-07 MED ORDER — DIPHENOXYLATE-ATROPINE 2.5-0.025 MG PO TABS: 1.0000 | ORAL_TABLET | Freq: Two times a day (BID) | ORAL | Status: AC | PRN

## 2012-02-07 MED ORDER — HYOSCYAMINE SULFATE 0.125 MG SL SUBL: SUBLINGUAL_TABLET | SUBLINGUAL | Status: DC

## 2012-02-07 NOTE — Patient Instructions (Addendum)
We have sent the following medications to your pharmacy for you to pick up at your convenience: Lialda, Levsin, Lomotil.  cc: Leanna Battles, MD

## 2012-02-07 NOTE — Progress Notes (Signed)
History of Present Illness: This is a 78 year old female who relates ongoing problems with postprandial urgent loose stools and abdominal bloating. When her symptoms become more severe she uses hyoscyamine which improves her symptoms. The results of her recent colonoscopy were reviewed in detail including normal biopsies.   Current Medications, Allergies, Past Medical History, Past Surgical History, Family History and Social History were reviewed in [employer] record.  Physical Exam: General: Well developed , well nourished, no acute distress Head: Normocephalic and atraumatic Eyes:  sclerae anicteric, EOMI Ears: Normal auditory acuity Mouth: No deformity or lesions Lungs: Clear throughout to auscultation Heart: Regular rate and rhythm; no murmurs, rubs or bruits Abdomen: Soft, non tender and non distended. No masses, hepatosplenomegaly or hernias noted. Normal Bowel sounds Musculoskeletal: Symmetrical with no gross deformities  Pulses:  Normal pulses noted Extremities: No clubbing, cyanosis, edema or deformities noted Neurological: Alert oriented x 4, grossly nonfocal Psychological:  Alert and cooperative. Normal mood and affect  Assessment and Recommendations:  1. Irritable bowel syndrome. Use hyoscyamine 1-2 before meals and every 4 hours when necessary. Lomotil when necessary. If her symptoms are not adequately controlled consider trial of a antibiotic and/or a trial of the FODMAP diet.  2. Crohn's colitis. Recent colonoscopy was normal with normal biopsies. Continue Lialda.  3. Personal history of a tubulovillous adenoma. Surveillance colonoscopy recommended in 3 years, September 2016.

## 2012-06-15 ENCOUNTER — Other Ambulatory Visit: Payer: Self-pay | Admitting: Gastroenterology

## 2012-10-31 ENCOUNTER — Other Ambulatory Visit: Payer: Self-pay

## 2012-10-31 DIAGNOSIS — Z1231 Encounter for screening mammogram for malignant neoplasm of breast: Secondary | ICD-10-CM

## 2012-11-20 ENCOUNTER — Ambulatory Visit
Admission: RE | Admit: 2012-11-20 | Discharge: 2012-11-20 | Disposition: A | Discharge status: Home / Self Care | Payer: Medicare Other | Source: Ambulatory Visit

## 2012-11-20 DIAGNOSIS — Z1231 Encounter for screening mammogram for malignant neoplasm of breast: Secondary | ICD-10-CM

## 2012-11-23 ENCOUNTER — Other Ambulatory Visit: Payer: Self-pay | Admitting: Internal Medicine

## 2012-11-23 DIAGNOSIS — R928 Other abnormal and inconclusive findings on diagnostic imaging of breast: Secondary | ICD-10-CM

## 2012-12-07 ENCOUNTER — Ambulatory Visit
Admission: RE | Admit: 2012-12-07 | Discharge: 2012-12-07 | Disposition: A | Discharge status: Home / Self Care | Payer: Medicare Other | Source: Ambulatory Visit | Attending: Internal Medicine | Admitting: Internal Medicine

## 2012-12-07 DIAGNOSIS — R928 Other abnormal and inconclusive findings on diagnostic imaging of breast: Secondary | ICD-10-CM

## 2013-02-01 ENCOUNTER — Other Ambulatory Visit: Payer: Self-pay | Admitting: Gastroenterology

## 2013-02-01 NOTE — Telephone Encounter (Signed)
NEEDS OFFICE VISIT FOR ANY FURTHER REFILLS! 

## 2013-03-04 ENCOUNTER — Other Ambulatory Visit: Payer: Self-pay | Admitting: Gastroenterology

## 2013-03-16 ENCOUNTER — Telehealth: Payer: Self-pay | Admitting: Gastroenterology

## 2013-03-16 MED ORDER — MESALAMINE 1.2 G PO TBEC: DELAYED_RELEASE_TABLET | ORAL | Status: DC

## 2013-03-16 NOTE — Telephone Encounter (Signed)
Sent one refill to patient's pharmacy and told to keep appt for any further refills.

## 2013-04-09 ENCOUNTER — Ambulatory Visit (INDEPENDENT_AMBULATORY_CARE_PROVIDER_SITE_OTHER): Payer: Medicare Other | Admitting: Gastroenterology

## 2013-04-09 ENCOUNTER — Other Ambulatory Visit: Payer: Self-pay | Admitting: Gastroenterology

## 2013-04-09 ENCOUNTER — Encounter: Payer: Self-pay | Admitting: Gastroenterology

## 2013-04-09 VITALS — BP 132/60 | HR 72 | Ht 60.0 in | Wt 139.6 lb

## 2013-04-09 DIAGNOSIS — Z23 Encounter for immunization: Secondary | ICD-10-CM

## 2013-04-09 DIAGNOSIS — K219 Gastro-esophageal reflux disease without esophagitis: Secondary | ICD-10-CM

## 2013-04-09 DIAGNOSIS — K5010 Crohn's disease of large intestine without complications: Secondary | ICD-10-CM

## 2013-04-09 DIAGNOSIS — K589 Irritable bowel syndrome without diarrhea: Secondary | ICD-10-CM

## 2013-04-09 MED ORDER — HYOSCYAMINE SULFATE 0.125 MG PO TABS: ORAL_TABLET | ORAL | Status: DC

## 2013-04-09 MED ORDER — MESALAMINE 1.2 G PO TBEC: DELAYED_RELEASE_TABLET | ORAL | Status: DC

## 2013-04-09 MED ORDER — OMEPRAZOLE 20 MG PO CPDR: 20.0000 mg | DELAYED_RELEASE_CAPSULE | Freq: Every day | ORAL | Status: DC

## 2013-04-09 NOTE — Patient Instructions (Signed)
We have sent the following medications to your pharmacy for you to pick up at your convenience:Levsin, omeprazole, and Lialda.  We will obtain recent labs and immunization records from Dr Leanna Battles. We will call you if we need to have you come back for any vaccinations.   Thank you for choosing me and [facility].  Pricilla Riffle. Dagoberto Ligas., MD., Marval Regal  cc: Leanna Battles, MD

## 2013-04-09 NOTE — Progress Notes (Signed)
    History of Present Illness: This is a 80 year old female with Crohn's colitis, IBS and GERD. All symptoms are under good control at this time.  Current Medications, Allergies, Past Medical History, Past Surgical History, Family History and Social History were reviewed in [employer] record.  Physical Exam: General: Well developed , well nourished, no acute distress Head: Normocephalic and atraumatic Eyes:  sclerae anicteric, EOMI Ears: Normal auditory acuity Mouth: No deformity or lesions Lungs: Clear throughout to auscultation Heart: Regular rate and rhythm; no murmurs, rubs or bruits Abdomen: Soft, non tender and non distended. No masses, hepatosplenomegaly or hernias noted. Normal Bowel sounds Musculoskeletal: Symmetrical with no gross deformities  Pulses:  Normal pulses noted Extremities: No clubbing, cyanosis, edema or deformities noted Neurological: Alert oriented x 4, grossly nonfocal Psychological:  Alert and cooperative. Normal mood and affect  Assessment and Recommendations:  1. Irritable bowel syndrome. Use hyoscyamine 1-2 before meals and every 4 hours when necessary. Lomotil when necessary. If her symptoms are not adequately controlled consider trial of a antibiotic and/or a trial of the FODMAP diet.   2. Crohn's colitis. Last colonoscopy was normal with normal biopsies. Continue Lialda. Vaccine status reviewed, will obtain records from Dr. Philip Aspen and those due will be administered.  3. Personal history of a tubulovillous adenoma. Surveillance colonoscopy recommended in 3 years, September 2016.

## 2013-04-13 ENCOUNTER — Ambulatory Visit (INDEPENDENT_AMBULATORY_CARE_PROVIDER_SITE_OTHER): Payer: Medicare Other | Admitting: Gastroenterology

## 2013-04-13 DIAGNOSIS — Z23 Encounter for immunization: Secondary | ICD-10-CM

## 2013-04-20 ENCOUNTER — Ambulatory Visit (INDEPENDENT_AMBULATORY_CARE_PROVIDER_SITE_OTHER): Payer: Medicare Other | Admitting: Gastroenterology

## 2013-04-20 DIAGNOSIS — Z23 Encounter for immunization: Secondary | ICD-10-CM

## 2013-05-04 ENCOUNTER — Other Ambulatory Visit: Payer: Self-pay

## 2013-05-04 MED ORDER — OMEPRAZOLE 20 MG PO CPDR: 20.0000 mg | DELAYED_RELEASE_CAPSULE | Freq: Every day | ORAL | Status: DC

## 2013-05-07 ENCOUNTER — Ambulatory Visit (INDEPENDENT_AMBULATORY_CARE_PROVIDER_SITE_OTHER): Payer: Medicare Other | Admitting: Gastroenterology

## 2013-05-07 DIAGNOSIS — Z23 Encounter for immunization: Secondary | ICD-10-CM

## 2013-05-08 ENCOUNTER — Other Ambulatory Visit: Payer: Self-pay

## 2013-05-08 MED ORDER — MESALAMINE 1.2 G PO TBEC: DELAYED_RELEASE_TABLET | ORAL | Status: DC

## 2013-06-30 ENCOUNTER — Other Ambulatory Visit: Payer: Self-pay | Admitting: Gastroenterology

## 2013-07-12 ENCOUNTER — Other Ambulatory Visit: Payer: Self-pay | Admitting: Dermatology

## 2013-09-23 ENCOUNTER — Other Ambulatory Visit: Payer: Self-pay | Admitting: Gastroenterology

## 2013-10-30 ENCOUNTER — Other Ambulatory Visit: Payer: Self-pay | Admitting: Gastroenterology

## 2013-11-01 ENCOUNTER — Other Ambulatory Visit: Payer: Self-pay | Admitting: Gastroenterology

## 2013-12-07 ENCOUNTER — Other Ambulatory Visit: Payer: Self-pay | Admitting: Gastroenterology

## 2013-12-15 ENCOUNTER — Other Ambulatory Visit: Payer: Self-pay | Admitting: Gastroenterology

## 2014-01-29 ENCOUNTER — Other Ambulatory Visit: Payer: Self-pay | Admitting: Gastroenterology

## 2014-04-09 ENCOUNTER — Telehealth: Payer: Self-pay | Admitting: Gastroenterology

## 2014-04-09 ENCOUNTER — Other Ambulatory Visit: Payer: Self-pay

## 2014-04-09 DIAGNOSIS — Z1231 Encounter for screening mammogram for malignant neoplasm of breast: Secondary | ICD-10-CM

## 2014-04-09 MED ORDER — OMEPRAZOLE 20 MG PO CPDR: 20.0000 mg | DELAYED_RELEASE_CAPSULE | Freq: Every day | ORAL | Status: DC

## 2014-04-09 MED ORDER — MESALAMINE 1.2 G PO TBEC: DELAYED_RELEASE_TABLET | ORAL | Status: DC

## 2014-04-09 NOTE — Telephone Encounter (Signed)
Prescriptions sent to [facility] with no refills until scheduled appt on 05/14/14.

## 2014-04-15 ENCOUNTER — Ambulatory Visit
Admission: RE | Admit: 2014-04-15 | Discharge: 2014-04-15 | Disposition: A | Discharge status: Home / Self Care | Payer: Medicare Other | Source: Ambulatory Visit

## 2014-04-15 DIAGNOSIS — Z1231 Encounter for screening mammogram for malignant neoplasm of breast: Secondary | ICD-10-CM

## 2014-04-30 ENCOUNTER — Other Ambulatory Visit: Payer: Self-pay | Admitting: Gastroenterology

## 2014-05-14 ENCOUNTER — Encounter: Payer: Self-pay | Admitting: Gastroenterology

## 2014-05-14 ENCOUNTER — Ambulatory Visit (INDEPENDENT_AMBULATORY_CARE_PROVIDER_SITE_OTHER): Payer: Medicare Other | Admitting: Gastroenterology

## 2014-05-14 VITALS — BP 142/70 | HR 64 | Ht 60.0 in | Wt 136.0 lb

## 2014-05-14 DIAGNOSIS — K5010 Crohn's disease of large intestine without complications: Secondary | ICD-10-CM

## 2014-05-14 DIAGNOSIS — K589 Irritable bowel syndrome without diarrhea: Secondary | ICD-10-CM

## 2014-05-14 DIAGNOSIS — Z23 Encounter for immunization: Secondary | ICD-10-CM

## 2014-05-14 DIAGNOSIS — K219 Gastro-esophageal reflux disease without esophagitis: Secondary | ICD-10-CM

## 2014-05-14 MED ORDER — HYOSCYAMINE SULFATE 0.125 MG PO TABS: ORAL_TABLET | ORAL | Status: DC

## 2014-05-14 NOTE — Patient Instructions (Signed)
We have given you your last Twinrix injection today.  We have sent the following medications to your pharmacy for you to pick up at your convenience: Levsin.  Thank you for choosing me and [facility].  Pricilla Riffle. Dagoberto Ligas., MD., Marval Regal  cc: Leanna Battles, MD

## 2014-05-14 NOTE — Progress Notes (Signed)
History of Present Illness: This is a 81 year old female returning for follow-up of Crohn's colitis, IBS and GERD. She states she has been doing very well for the past 6 months. She has made some adjustments in her diet. She uses hyoscyamine infrequently. When she discontinues Prilosec her reflux symptoms promptly returned. She states she had blood work performed in Dr. Shon Baton office in July.  Allergies  Allergen Reactions  . Erythromycin Itching  . Tetanus Toxoids Swelling    Swelling of arm   Outpatient Prescriptions Prior to Visit  Medication Sig Dispense Refill  . aspirin 81 MG tablet Take 81 mg by mouth daily.      . Cholecalciferol (VITAMIN D) 2000 UNITS CAPS Take by mouth daily.    . fenofibrate 160 MG tablet Take 160 mg by mouth daily.      . mesalamine (LIALDA) 1.2 G EC tablet TAKE 2 TABLETS BY MOUTH EVERY DAY WITH BREAKFAST 180 tablet 0  . Omega-3 Fatty Acids (FISH OIL) 1200 MG CAPS Take 1 capsule by mouth daily.      Marland Kitchen omeprazole (PRILOSEC) 20 MG capsule Take 1 capsule (20 mg total) by mouth daily. 90 capsule 0  . triamterene-hydrochlorothiazide (MAXZIDE-25) 37.5-25 MG per tablet Take 1 tablet by mouth daily.     . hyoscyamine (LEVSIN, ANASPAZ) 0.125 MG tablet PLACE 1-2 TABLETS UNDER TONGUE EVERY 4 HOURS AS NEEDED FOR ABDOMINAL PAIN 120 tablet 11  . hyoscyamine (LEVSIN, ANASPAZ) 0.125 MG tablet PLACE 1-2 TABLETS UNDER TONGUE EVERY 4 HOURS AS NEEDED FOR ABDOMINAL PAIN 60 tablet 1  . hyoscyamine (LEVSIN, ANASPAZ) 0.125 MG tablet PLACE 1-2 TABLETS UNDER TONGUE EVERY 4 HOURS AS NEEDED FOR ABDOMINAL PAIN 60 tablet 1   No facility-administered medications prior to visit.   Past Medical History  Diagnosis Date  . 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 Hypertension   . Crohn's colitis 10/2008  . Allergy   . GERD (gastroesophageal reflux disease)    Past Surgical History  Procedure Laterality Date  . Hemicolectomy  2001  . Cholecystectomy  2001  . Vaginal  hysterectomy    . Appendectomy    . Foot surgery    . Hand surgery     History   Social History  . Marital Status: Widowed    Spouse Name: N/A    Number of Children: 1  . Years of Education: N/A   Occupational History  . Retired    Social History Main Topics  . Smoking status: Former Smoker    Quit date: 04/05/1980  . Smokeless tobacco: Never Used  . Alcohol Use: 1.2 oz/week    2 Glasses of wine per week     Comment: socially  . Drug Use: No  . Sexual Activity: None   Other Topics Concern  . None   Social History Narrative   Family History  Problem Relation Age of Onset  . Prostate cancer Father   . Bone cancer Father   . Breast cancer Sister   . Colon cancer Neg Hx   . Stomach cancer Neg Hx       Physical Exam: General: Well developed , well nourished, no acute distress Head: Normocephalic and atraumatic Eyes:  sclerae anicteric, EOMI Ears: Normal auditory acuity Mouth: No deformity or lesions Lungs: Clear throughout to auscultation Heart: Regular rate and rhythm; no murmurs, rubs or bruits Abdomen: Soft, non tender and non distended. No masses, hepatosplenomegaly or hernias noted. Normal Bowel sounds Musculoskeletal: Symmetrical with no gross deformities  Pulses:  Normal pulses noted Extremities: No clubbing, cyanosis, edema or deformities noted Neurological: Alert oriented x 4, grossly nonfocal Psychological:  Alert and cooperative. Normal mood and affect  Assessment and Recommendations:  1. Irritable bowel syndrome. Use hyoscyamine 1-2 before meals and every 4 hours when necessary. Lomotil when necessary. Consider trial of a probiotic and/or a trial of the FODMAP diet.   2. Crohn's colitis. Last colonoscopy was normal with normal biopsies. Continue Lialda. Vaccine status reviewed. Request blood work from Dr. Philip Aspen.  3. Personal history of a tubulovillous adenoma. Surveillance colonoscopy recommended in 3 years, September 2016.  Office visit of 20  minutes. Over 50% of time was spent counseling the patient on current medications and management of above problems.

## 2014-05-14 NOTE — Assessment & Plan Note (Signed)
Last colonoscopy was normal with normal biopsies. Continue Lialda. Vaccine status reviewed. Request blood work from Dr. Philip Aspen.

## 2014-07-03 ENCOUNTER — Other Ambulatory Visit: Payer: Self-pay | Admitting: Gastroenterology

## 2014-08-26 ENCOUNTER — Other Ambulatory Visit: Payer: Self-pay | Admitting: Gastroenterology

## 2014-11-07 ENCOUNTER — Encounter: Payer: Self-pay | Admitting: Gastroenterology

## 2014-12-20 ENCOUNTER — Encounter: Payer: Self-pay | Admitting: Gastroenterology

## 2015-02-05 ENCOUNTER — Ambulatory Visit: Payer: Medicare Other | Admitting: Gastroenterology

## 2015-02-17 ENCOUNTER — Ambulatory Visit (AMBULATORY_SURGERY_CENTER): Payer: Self-pay

## 2015-02-17 VITALS — Ht 60.0 in | Wt 137.0 lb

## 2015-02-17 DIAGNOSIS — Z860100 Personal history of colon polyps, unspecified: Secondary | ICD-10-CM

## 2015-02-17 NOTE — Progress Notes (Signed)
No allergies to eggs or soy No past problems with anesthesia No diet/weight loss meds No home oxygen  Has email no internet; refused emmi due to having this before

## 2015-02-18 ENCOUNTER — Other Ambulatory Visit: Payer: Self-pay | Admitting: Gastroenterology

## 2015-03-03 ENCOUNTER — Encounter: Payer: Medicare Other | Admitting: Gastroenterology

## 2015-03-05 ENCOUNTER — Encounter: Payer: Self-pay | Admitting: Gastroenterology

## 2015-03-18 ENCOUNTER — Encounter: Payer: Self-pay | Admitting: Gastroenterology

## 2015-03-18 ENCOUNTER — Ambulatory Visit (AMBULATORY_SURGERY_CENTER): Payer: Medicare Other | Admitting: Gastroenterology

## 2015-03-18 VITALS — BP 144/58 | HR 58 | Temp 98.0°F | Resp 13 | Ht 60.0 in | Wt 127.0 lb

## 2015-03-18 DIAGNOSIS — D123 Benign neoplasm of transverse colon: Secondary | ICD-10-CM | POA: Diagnosis not present

## 2015-03-18 DIAGNOSIS — Z86010 Personal history of colonic polyps: Secondary | ICD-10-CM

## 2015-03-18 DIAGNOSIS — D124 Benign neoplasm of descending colon: Secondary | ICD-10-CM

## 2015-03-18 MED ORDER — SODIUM CHLORIDE 0.9 % IV SOLN: 500.0000 mL | INTRAVENOUS | Status: DC

## 2015-03-18 NOTE — Op Note (Addendum)
[place]  [address]. [place], [address]   COLONOSCOPY PROCEDURE REPORT  PATIENT: Sandra Yang, Sandra Yang  MR#: [id number] BIRTHDATE: January 13, 1934 , 81  yrs. old GENDER: female ENDOSCOPIST: Lucio Edward T REFERRED BY: Leanna Battles MD PROCEDURE DATE:  03/18/2015 PROCEDURE:   Colonoscopy, surveillance and Colonoscopy with snare polypectomy First Screening Colonoscopy - Avg.  risk and is 50 yrs.  old or older - No.  Prior Negative Screening - Now for repeat screening. N/A  History of Adenoma - Now for follow-up colonoscopy & has been > or = to 3 yrs.  Yes hx of adenoma.  Has been 3 or more years since last colonoscopy.  Polyps removed today? Yes ASA CLASS:   Class II INDICATIONS:Surveillance due to prior colonic neoplasia and Advanced Neoplasm (= 10 mm, high grade dysplasia, villous component. MEDICATIONS: Monitored anesthesia care and Propofol 150 mg IV DESCRIPTION OF PROCEDURE:   After the risks benefits and alternatives of the procedure were thoroughly explained, informed consent was obtained.  The digital rectal exam revealed no abnormalities of the rectum.   The LB PCF Q180 [id number]  endoscope was introduced through the anus and advanced to the anastomosis. vNo adverse events experienced.   The quality of the prep was good. (Suprep was used)  The instrument was then slowly withdrawn as the colon was fully examined. Estimated blood loss is zero unless otherwise noted in this procedure report.    COLON FINDINGS: Four sessile polyps measuring 5 mm in size were found in the descending colon.  Polypectomies were performed with a cold snare.  The resection was complete, the polyp tissue was completely retrieved and sent to histology.   There was evidence of a normal appearing prior surgical anastomosis in the ascending colon.   The examination was otherwise normal.  Retroflexed views revealed no abnormalities. The time to cecum = 2.9 Withdrawal time = 8.2   The  scope was withdrawn and the procedure completed. COMPLICATIONS: There were no immediate complications.  ENDOSCOPIC IMPRESSION: 1.   Four sessile polyps in the descending colon; polypectomies performed with a cold snare 2.   Prior surgical anastomosis in the ascending colon  RECOMMENDATIONS: 1.  Await pathology results 2.  Given your age, you will not need another colonoscopy for colon cancer screening or polyp surveillance.  These types of tests usually stop around the age 62.  eSigned:  Ladene Artist, MD, [facility] 06/02/2015 9:56 AM Revised: 06/02/2015 9:56 AM

## 2015-03-18 NOTE — Progress Notes (Signed)
Called to room to assist during endoscopic procedure.  Patient ID and intended procedure confirmed with present staff. Received instructions for my participation in the procedure from the performing physician.  

## 2015-03-18 NOTE — Patient Instructions (Signed)
YOU HAD AN ENDOSCOPIC PROCEDURE TODAY AT [facility]:   Refer to the procedure report that was given to you for any specific questions about what was found during the examination.  If the procedure report does not answer your questions, please call your gastroenterologist to clarify.  If you requested that your care partner not be given the details of your procedure findings, then the procedure report has been included in a sealed envelope for you to review at your convenience later.  YOU SHOULD EXPECT: Some feelings of bloating in the abdomen. Passage of more gas than usual.  Walking can help get rid of the air that was put into your GI tract during the procedure and reduce the bloating. If you had a lower endoscopy (such as a colonoscopy or flexible sigmoidoscopy) you may notice spotting of blood in your stool or on the toilet paper. If you underwent a bowel prep for your procedure, you may not have a normal bowel movement for a few days.  Please Note:  You might notice some irritation and congestion in your nose or some drainage.  This is from the oxygen used during your procedure.  There is no need for concern and it should clear up in a day or so.  SYMPTOMS TO REPORT IMMEDIATELY:   Following lower endoscopy (colonoscopy or flexible sigmoidoscopy):  Excessive amounts of blood in the stool  Significant tenderness or worsening of abdominal pains  Swelling of the abdomen that is new, acute  Fever of 100F or higher   For urgent or emergent issues, a gastroenterologist can be reached at any hour by calling [phone].   DIET: Your first meal following the procedure should be a small meal and then it is ok to progress to your normal diet. Heavy or fried foods are harder to digest and may make you feel nauseous or bloated.  Likewise, meals heavy in dairy and vegetables can increase bloating.  Drink plenty of fluids but you should avoid alcoholic beverages for 24  hours.  ACTIVITY:  You should plan to take it easy for the rest of today and you should NOT DRIVE or use heavy machinery until tomorrow (because of the sedation medicines used during the test).    FOLLOW UP: Our staff will call the number listed on your records the next business day following your procedure to check on you and address any questions or concerns that you may have regarding the information given to you following your procedure. If we do not reach you, we will leave a message.  However, if you are feeling well and you are not experiencing any problems, there is no need to return our call.  We will assume that you have returned to your regular daily activities without incident.  If any biopsies were taken you will be contacted by phone or by letter within the next 1-3 weeks.  Please call us at [phone] if you have not heard about the biopsies in 3 weeks.    SIGNATURES/CONFIDENTIALITY: You and/or your care partner have signed paperwork which will be entered into your electronic medical record.  These signatures attest to the fact that that the information above on your After Visit Summary has been reviewed and is understood.  Full responsibility of the confidentiality of this discharge information lies with you and/or your care-partner.    Resume medications. Information given on polyps.

## 2015-03-18 NOTE — Progress Notes (Signed)
Report to PACU, RN, vss, BBS= Clear.  

## 2015-03-19 ENCOUNTER — Telehealth: Payer: Self-pay | Admitting: *Deleted

## 2015-03-19 NOTE — Telephone Encounter (Signed)
  Follow up Call-  Call back number 03/18/2015  Post procedure Call Back phone  # [phone]  Permission to leave phone message Yes     No answer, left message.

## 2015-03-28 ENCOUNTER — Encounter: Payer: Self-pay | Admitting: Gastroenterology

## 2015-04-14 ENCOUNTER — Other Ambulatory Visit: Payer: Self-pay | Admitting: Gastroenterology

## 2015-04-15 ENCOUNTER — Encounter: Payer: Self-pay | Admitting: Gastroenterology

## 2015-04-23 DIAGNOSIS — L84 Corns and callosities: Secondary | ICD-10-CM | POA: Diagnosis not present

## 2015-04-23 DIAGNOSIS — M79674 Pain in right toe(s): Secondary | ICD-10-CM | POA: Diagnosis not present

## 2015-04-26 ENCOUNTER — Emergency Department (HOSPITAL_COMMUNITY): Payer: Medicare HMO

## 2015-04-26 ENCOUNTER — Encounter (HOSPITAL_COMMUNITY): Payer: Self-pay | Admitting: Emergency Medicine

## 2015-04-26 ENCOUNTER — Observation Stay (HOSPITAL_COMMUNITY)
Admission: EM | Admit: 2015-04-26 | Discharge: 2015-04-27 | Disposition: A | Discharge status: Home / Self Care | Payer: Medicare HMO | Attending: Family Medicine | Admitting: Family Medicine

## 2015-04-26 DIAGNOSIS — R0602 Shortness of breath: Secondary | ICD-10-CM | POA: Diagnosis not present

## 2015-04-26 DIAGNOSIS — E7800 Pure hypercholesterolemia, unspecified: Secondary | ICD-10-CM | POA: Insufficient documentation

## 2015-04-26 DIAGNOSIS — K219 Gastro-esophageal reflux disease without esophagitis: Secondary | ICD-10-CM | POA: Diagnosis not present

## 2015-04-26 DIAGNOSIS — K5090 Crohn's disease, unspecified, without complications: Secondary | ICD-10-CM | POA: Insufficient documentation

## 2015-04-26 DIAGNOSIS — E785 Hyperlipidemia, unspecified: Secondary | ICD-10-CM | POA: Diagnosis present

## 2015-04-26 DIAGNOSIS — I200 Unstable angina: Secondary | ICD-10-CM | POA: Diagnosis not present

## 2015-04-26 DIAGNOSIS — R9431 Abnormal electrocardiogram [ECG] [EKG]: Secondary | ICD-10-CM | POA: Diagnosis not present

## 2015-04-26 DIAGNOSIS — I10 Essential (primary) hypertension: Secondary | ICD-10-CM | POA: Diagnosis present

## 2015-04-26 DIAGNOSIS — R0789 Other chest pain: Secondary | ICD-10-CM | POA: Diagnosis not present

## 2015-04-26 DIAGNOSIS — R69 Illness, unspecified: Secondary | ICD-10-CM | POA: Diagnosis not present

## 2015-04-26 DIAGNOSIS — Z87891 Personal history of nicotine dependence: Secondary | ICD-10-CM | POA: Insufficient documentation

## 2015-04-26 DIAGNOSIS — Z7982 Long term (current) use of aspirin: Secondary | ICD-10-CM | POA: Diagnosis not present

## 2015-04-26 DIAGNOSIS — R079 Chest pain, unspecified: Secondary | ICD-10-CM | POA: Diagnosis not present

## 2015-04-26 DIAGNOSIS — K5010 Crohn's disease of large intestine without complications: Secondary | ICD-10-CM | POA: Diagnosis present

## 2015-04-26 LAB — CBC
HEMATOCRIT: 42.9 % (ref 36.0–46.0)
Hemoglobin: 14.6 g/dL (ref 12.0–15.0)
MCH: 31.1 pg (ref 26.0–34.0)
MCHC: 34 g/dL (ref 30.0–36.0)
MCV: 91.5 fL (ref 78.0–100.0)
PLATELETS: 191 10*3/uL (ref 150–400)
RBC: 4.69 MIL/uL (ref 3.87–5.11)
RDW: 13.1 % (ref 11.5–15.5)
WBC: 4.8 10*3/uL (ref 4.0–10.5)

## 2015-04-26 LAB — LIPID PANEL
Cholesterol: 177 mg/dL (ref 0–200)
HDL: 58 mg/dL (ref >40)
LDL Cholesterol: 99 mg/dL (ref 0–99)
Total CHOL/HDL Ratio: 3.1 RATIO
Triglycerides: 98 mg/dL (ref <150)
VLDL: 20 mg/dL (ref 0–40)

## 2015-04-26 LAB — BASIC METABOLIC PANEL
ANION GAP: 8 (ref 5–15)
BUN: 21 mg/dL — ABNORMAL HIGH (ref 6–20)
CALCIUM: 10.1 mg/dL (ref 8.9–10.3)
CO2: 29 mmol/L (ref 22–32)
Chloride: 106 mmol/L (ref 101–111)
Creatinine, Ser: 0.84 mg/dL (ref 0.44–1.00)
GFR calc Af Amer: >60 mL/min (ref >60)
GLUCOSE: 103 mg/dL — AB (ref 65–99)
Potassium: 3.8 mmol/L (ref 3.5–5.1)
Sodium: 143 mmol/L (ref 135–145)

## 2015-04-26 LAB — TROPONIN I
Troponin I: <0.03 ng/mL (ref <0.031)
Troponin I: <0.03 ng/mL (ref <0.031)

## 2015-04-26 LAB — I-STAT TROPONIN, ED: TROPONIN I, POC: 0.01 ng/mL (ref 0.00–0.08)

## 2015-04-26 MED ORDER — MORPHINE SULFATE (PF) 2 MG/ML IV SOLN: 2.0000 mg | INTRAVENOUS | Status: DC | PRN

## 2015-04-26 MED ORDER — GI COCKTAIL ~~LOC~~: 30.0000 mL | Freq: Four times a day (QID) | ORAL | Status: DC | PRN

## 2015-04-26 MED ORDER — ACETAMINOPHEN 325 MG PO TABS
650.0000 mg | ORAL_TABLET | ORAL | Status: DC | PRN
  Administered 2015-04-26 – 2015-04-27 (×3): 650 mg via ORAL
  Filled 2015-04-26 (×2): qty 2

## 2015-04-26 MED ORDER — NITROGLYCERIN 2 % TD OINT
0.5000 [in_us] | TOPICAL_OINTMENT | Freq: Four times a day (QID) | TRANSDERMAL | Status: DC
  Administered 2015-04-26 – 2015-04-27 (×3): 0.5 [in_us] via TOPICAL
  Filled 2015-04-26: qty 30

## 2015-04-26 MED ORDER — METOPROLOL TARTRATE 12.5 MG HALF TABLET
12.5000 mg | ORAL_TABLET | Freq: Two times a day (BID) | ORAL | Status: DC
  Administered 2015-04-27: 12.5 mg via ORAL
  Filled 2015-04-26: qty 1

## 2015-04-26 MED ORDER — ENOXAPARIN SODIUM 30 MG/0.3ML ~~LOC~~ SOLN
30.0000 mg | SUBCUTANEOUS | Status: DC
  Administered 2015-04-26 – 2015-04-27 (×2): 30 mg via SUBCUTANEOUS
  Filled 2015-04-26 (×2): qty 0.3

## 2015-04-26 MED ORDER — ASPIRIN 325 MG PO TABS
325.0000 mg | ORAL_TABLET | Freq: Once | ORAL | Status: AC
  Administered 2015-04-26: 325 mg via ORAL
  Filled 2015-04-26: qty 1

## 2015-04-26 MED ORDER — ASPIRIN EC 81 MG PO TBEC
81.0000 mg | DELAYED_RELEASE_TABLET | Freq: Every day | ORAL | Status: DC
  Administered 2015-04-26 – 2015-04-27 (×2): 81 mg via ORAL
  Filled 2015-04-26 (×2): qty 1

## 2015-04-26 MED ORDER — TRIAMTERENE-HCTZ 37.5-25 MG PO TABS
1.0000 | ORAL_TABLET | Freq: Every day | ORAL | Status: DC
  Administered 2015-04-26: 1 via ORAL
  Filled 2015-04-26: qty 1

## 2015-04-26 MED ORDER — FENOFIBRATE 160 MG PO TABS
160.0000 mg | ORAL_TABLET | Freq: Every day | ORAL | Status: DC
Start: 2015-04-26 — End: 2015-04-27
  Administered 2015-04-26 – 2015-04-27 (×2): 160 mg via ORAL
  Filled 2015-04-26 (×2): qty 1

## 2015-04-26 MED ORDER — PANTOPRAZOLE SODIUM 40 MG PO TBEC
40.0000 mg | DELAYED_RELEASE_TABLET | Freq: Every day | ORAL | Status: DC
  Administered 2015-04-26 – 2015-04-27 (×2): 40 mg via ORAL
  Filled 2015-04-26 (×2): qty 1

## 2015-04-26 MED ORDER — ONDANSETRON HCL 4 MG/2ML IJ SOLN: 4.0000 mg | Freq: Four times a day (QID) | INTRAMUSCULAR | Status: DC | PRN

## 2015-04-26 MED ORDER — MESALAMINE 1.2 G PO TBEC
1.2000 g | DELAYED_RELEASE_TABLET | Freq: Every day | ORAL | Status: DC
  Administered 2015-04-26 – 2015-04-27 (×2): 1.2 g via ORAL
  Filled 2015-04-26 (×3): qty 1

## 2015-04-26 MED ORDER — METOPROLOL TARTRATE 12.5 MG HALF TABLET
12.5000 mg | ORAL_TABLET | Freq: Two times a day (BID) | ORAL | Status: DC
  Administered 2015-04-26: 12.5 mg via ORAL
  Filled 2015-04-26: qty 1

## 2015-04-26 MED ORDER — VITAMIN D 1000 UNITS PO TABS
2000.0000 [IU] | ORAL_TABLET | Freq: Every day | ORAL | Status: DC
  Administered 2015-04-26 – 2015-04-27 (×2): 2000 [IU] via ORAL
  Filled 2015-04-26 (×2): qty 2

## 2015-04-26 NOTE — ED Notes (Signed)
Attempted to call report

## 2015-04-26 NOTE — Consult Note (Signed)
Reason for Consult: Chest pain/minor EKG changes Referring Physician: [facility]  Sandra Yang is an 82 y.o. female.  HPI: Patient is 82 year old female with past medical history significant for hypertension, hyperlipidemia, remote tobacco abuse, GERD, history of Crohn's disease, came to the ER complaining of recurrent retrosternal chest pain described as pressure grade 4-5/10 radiating to left arm associated with left arm numbness off and on since 10 AM yesterday states chest pain was associated with mild shortness of breath pain lasted anywhere between 20-30 minutes occasionally improved with belching but as it pain got worse last night so decided to come to the ED with her daughter. Patient denies any history of exertional chest pain but gives history of exertional dyspnea. Denies palpitation lightheadedness or syncope states had nuclear stress test many years ago and subsequently had cardiac catheterization and was told to have mild disease. No old records are available. EKG done in the ED showed normal sinus rhythm with T wave inversion in V1 and V2 and minimal ST-T wave changes noted. First set of troponin I is negative.  Past Medical History  Diagnosis Date  . 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 Hypertension   . Crohn's colitis ([place]) 10/2008  . Allergy   . GERD (gastroesophageal reflux disease)     Past Surgical History  Procedure Laterality Date  . Hemicolectomy  2001  . Cholecystectomy  2001  . Vaginal hysterectomy    . Appendectomy    . Foot surgery    . Hand surgery      Family History  Problem Relation Age of Onset  . Prostate cancer Father   . Bone cancer Father   . Breast cancer Sister   . Colon cancer Neg Hx   . Stomach cancer Neg Hx     Social History:  reports that she quit smoking about 35 years ago. She has never used smokeless tobacco. She reports that she drinks about 1.2 oz of alcohol per week. She reports that she does not use illicit  drugs.  Allergies:  Allergies  Allergen Reactions  . Erythromycin Itching  . Tetanus Toxoids Swelling    Swelling of arm  . Tape Rash    Medications: I have reviewed the patient's current medications.  Results for orders placed or performed during the hospital encounter of 04/26/15 (from the past 48 hour(s))  I-stat troponin, ED (not at [facility])     Status: None   Collection Time: 04/26/15 12:59 AM  Result Value Ref Range   Troponin i, poc 0.01 0.00 - 0.08 ng/mL   Comment 3            Comment: Due to the release kinetics of cTnI, a negative result within the first hours of the onset of symptoms does not rule out myocardial infarction with certainty. If myocardial infarction is still suspected, repeat the test at appropriate intervals.   Basic metabolic panel     Status: Abnormal   Collection Time: 04/26/15  1:04 AM  Result Value Ref Range   Sodium 143 135 - 145 mmol/L   Potassium 3.8 3.5 - 5.1 mmol/L   Chloride 106 101 - 111 mmol/L   CO2 29 22 - 32 mmol/L   Glucose, Bld 103 (H) 65 - 99 mg/dL   BUN 21 (H) 6 - 20 mg/dL   Creatinine, Ser 0.84 0.44 - 1.00 mg/dL   Calcium 10.1 8.9 - 10.3 mg/dL   GFR calc non Af Amer >60 >60 mL/min  GFR calc Af Amer >60 >60 mL/min    Comment: (NOTE) The eGFR has been calculated using the CKD EPI equation. This calculation has not been validated in all clinical situations. eGFR's persistently <60 mL/min signify possible Chronic Kidney Disease.    Anion gap 8 5 - 15  CBC     Status: None   Collection Time: 04/26/15  1:04 AM  Result Value Ref Range   WBC 4.8 4.0 - 10.5 K/uL   RBC 4.69 3.87 - 5.11 MIL/uL   Hemoglobin 14.6 12.0 - 15.0 g/dL   HCT 42.9 36.0 - 46.0 %   MCV 91.5 78.0 - 100.0 fL   MCH 31.1 26.0 - 34.0 pg   MCHC 34.0 30.0 - 36.0 g/dL   RDW 13.1 11.5 - 15.5 %   Platelets 191 150 - 400 K/uL    Dg Chest 2 View  04/26/2015  CLINICAL DATA:  Acute onset of central and left-sided chest tightness. Shortness of breath. Left arm  pain. Initial encounter. EXAM: CHEST  2 VIEW COMPARISON:  None. FINDINGS: The lungs are well-aerated. Mild peribronchial thickening is noted. There is no evidence of focal opacification, pleural effusion or pneumothorax. The heart is normal in size; the mediastinal contour is within normal limits. No acute osseous abnormalities are seen. IMPRESSION: Mild peribronchial thickening noted.  Lungs otherwise clear. Electronically Signed   By: Garald Balding M.D.   On: 04/26/2015 01:24    Review of Systems  Constitutional: Negative for fever and chills.  Eyes: Negative for blurred vision and double vision.  Respiratory: Positive for shortness of breath. Negative for cough, hemoptysis and sputum production.   Cardiovascular: Positive for chest pain. Negative for palpitations, orthopnea and claudication.  Gastrointestinal: Negative for nausea, vomiting and abdominal pain.  Genitourinary: Negative for dysuria.  Neurological: Negative for dizziness and headaches.   Blood pressure 140/60, pulse 57, temperature 97.6 F (36.4 C), temperature source Oral, resp. rate 16, height 5' 3" (1.6 m), weight 56.7 kg (125 lb), SpO2 100 %. Physical Exam  Constitutional: She is oriented to person, place, and time.  HENT:  Head: Normocephalic and atraumatic.  Eyes: Conjunctivae are normal. Pupils are equal, round, and reactive to light. Left eye exhibits no discharge.  Neck: Normal range of motion. Neck supple. No JVD present. No tracheal deviation present. No thyromegaly present.  Cardiovascular: Normal rate and regular rhythm.   Murmur (Soft systolic murmur and S4 gallop noted) heard. Respiratory: Effort normal and breath sounds normal. No respiratory distress. She has no wheezes.  GI: Soft. Bowel sounds are normal. She exhibits no distension. There is no tenderness. There is no rebound.  Musculoskeletal: She exhibits no edema.  Neurological: She is alert and oriented to person, place, and time.     Assessment/Plan: Unstable angina rule out MI Hypertension Hyperlipidemia GERD Remote tobacco abuse History of Crohn's disease Plan Check serial enzymes and EKG Start Nitropaste and low-dose beta blockers as per orders Check lipid panel Discussed with patient at length regarding left cardiac catheterization possible PTCA stenting its risk and benefits i.e. death MI stroke need for emergency CABG versus noninvasive nuclear stress testing its risk and benefits and wanted to proceed with nuclear stress test first. DC triamterene hydrochlorothiazide for now.  Charolette Forward 04/26/2015, 10:37 AM

## 2015-04-26 NOTE — ED Notes (Signed)
C/o pain to center and L chest since this morning with sob.  Denies nausea and vomiting.

## 2015-04-26 NOTE — Progress Notes (Signed)
7:49 AM I agree with HPI/GPe and A/P per Dr. Tamala Julian  82 y/o ? chronic Colitis IBS GERD Prior tubulivillous adenoma s/p distal ileal resection + cholecystectomy  Admitted with CP, no specific, some radaition donw L arm, no dizzy, no sob, no fever no chills Takes aspirin daily Non smoker Grandpa had heart disease  Heart score 5  Spoke to Dr. Terrence Dupont who will see and consider Stress test in am  Patient may have diet        Patient Active Problem List   Diagnosis Date Noted  . Chest pain 04/26/2015  . Hyperlipidemia 04/26/2015  . Essential hypertension 04/26/2015  . Esophageal reflux 11/25/2010  . FLATULENCE-GAS-BLOATING 08/04/2009  . CROHN'S DISEASE-LARGE INTESTINE 12/02/2008  . PERSONAL HX COLONIC POLYPS 12/02/2008   Verneita Griffes, MD [facility] [phone]

## 2015-04-26 NOTE — H&P (Signed)
[name] History and Physical  TEMA ALIRE YJE:[id number] DOB: May 01, 1933 DOA: 04/26/2015  Referring physician: ED PCP: Donnajean Lopes, MD   Chief Complaint: Chest pain  HPI:  [name] is a 82 year old female with a past medical history significant for Crohn's colitis, hypertension, hyperlipidemia, and acid reflux; who presents with complaints of chest pain. Patient reported intermittent tightness on the left side of her chest starting around 10 AM. Symptoms were reported to last 30 minutes or less sometimes radiating to the left arm.  Associated symptoms included shortness of breath and belching. Nothing worsens or alleviated symptoms Denies any fever, chills, abdominal pain, nausea, vomiting, or leg swelling.   Upon arrival to the emergency department patient was evaluated with labwork which was unremarkable, initial troponins negative, and EKG showing sinus rhythm with rightward axis. There was no previous EKG available for comparison.   Review of Systems  Constitutional: Negative for weight loss and malaise/fatigue.  HENT: Negative for ear pain and tinnitus.   Eyes: Negative for double vision and photophobia.  Respiratory: Positive for shortness of breath. Negative for hemoptysis.   Cardiovascular: Positive for chest pain. Negative for leg swelling.  Gastrointestinal: Negative for nausea and vomiting.  Genitourinary: Negative for urgency and frequency.  Musculoskeletal: Positive for joint pain. Negative for falls.  Skin: Negative for itching and rash.  Neurological: Negative for focal weakness and seizures.  Psychiatric/Behavioral: Negative for hallucinations and substance abuse.     Past Medical History  Diagnosis Date  . 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 Hypertension   . Crohn's colitis ([place]) 10/2008  . Allergy   . GERD (gastroesophageal reflux disease)      Past Surgical History  Procedure Laterality Date  . Hemicolectomy  2001  .  Cholecystectomy  2001  . Vaginal hysterectomy    . Appendectomy    . Foot surgery    . Hand surgery        Social History:  reports that she quit smoking about 35 years ago. She has never used smokeless tobacco. She reports that she drinks about 1.2 oz of alcohol per week. She reports that she does not use illicit drugs.   Allergies  Allergen Reactions  . Erythromycin Itching  . Tetanus Toxoids Swelling    Swelling of arm  . Tape Rash    Family History  Problem Relation Age of Onset  . Prostate cancer Father   . Bone cancer Father   . Breast cancer Sister   . Colon cancer Neg Hx   . Stomach cancer Neg Hx        Prior to Admission medications   Medication Sig Start Date End Date Taking? Authorizing Provider  aspirin 81 MG tablet Take 81 mg by mouth daily.     Yes Historical Provider, MD  Cholecalciferol (VITAMIN D) 2000 UNITS CAPS Take 2,000 Units by mouth daily.    Yes Historical Provider, MD  fenofibrate 160 MG tablet Take 160 mg by mouth daily.     Yes Historical Provider, MD  hyoscyamine (LEVSIN, ANASPAZ) 0.125 MG tablet PLACE 1-2 TABLETS UNDER TONGUE EVERY 4 HOURS AS NEEDED FOR ABDOMINAL PAIN 05/14/14  Yes Ladene Artist, MD  LIALDA 1.2 g EC tablet TAKE 2 TABLETS BY MOUTH EVERY DAY WITH BREAKFAST Patient taking differently: TAKE 1 TABLETS BY MOUTH EVERY DAY WITH BREAKFAST 04/15/15  Yes Ladene Artist, MD  Omega-3 Fatty Acids (FISH OIL) 1200 MG CAPS Take 1 capsule by mouth daily.  Yes Historical Provider, MD  omeprazole (PRILOSEC) 20 MG capsule TAKE 1 CAPSULE BY MOUTH ONCE A DAY 02/18/15  Yes Ladene Artist, MD  triamterene-hydrochlorothiazide (MAXZIDE-25) 37.5-25 MG per tablet Take 1 tablet by mouth daily.  11/09/11  Yes Historical Provider, MD     Physical Exam: Filed Vitals:   04/26/15 0245 04/26/15 0300 04/26/15 0315 04/26/15 0330  BP: 146/58 155/59 154/56 117/62  Pulse: 58 53 51 47  Temp:      TempSrc:      Resp: 17 14 12 14   SpO2: 98% 99% 98% 99%      Constitutional: Vital signs reviewed. Patient is a elderly female in no acute. Alert and oriented x3.  Head: Normocephalic and atraumatic  Ear: TM normal bilaterally  Mouth: no erythema or exudates, MMM  Eyes: PERRL, EOMI, conjunctivae normal, No scleral icterus.  Neck: Supple, Trachea midline normal ROM, No JVD, mass, thyromegaly, or carotid bruit present.  Cardiovascular: RRR, S1 normal, S2 normal, no MRG, pulses symmetric and intact bilaterally  Pulmonary/Chest: CTAB, no wheezes, rales, or rhonchi  Abdominal: Soft. Non-tender, non-distended, bowel sounds are normal, no masses, organomegaly, or guarding present.  GU: no CVA tenderness Musculoskeletal: No joint deformities, erythema, or stiffness, ROM full and no nontender Ext: no edema and no cyanosis, pulses palpable bilaterally (DP and PT)  Hematology: no cervical, inginal, or axillary adenopathy.  Neurological: A&O x3, Strenght is normal and symmetric bilaterally, cranial nerve II-XII are grossly intact, no focal motor deficit, sensory intact to light touch bilaterally.  Skin: Warm, dry and intact. No rash, cyanosis, or clubbing.  Psychiatric: Normal mood and affect. speech and behavior is normal. Judgment and thought content normal. Cognition and memory are normal.      Data Review   Micro Results No results found for this or any previous visit (from the past 240 hour(s)).  Radiology Reports Dg Chest 2 View  04/26/2015  CLINICAL DATA:  Acute onset of central and left-sided chest tightness. Shortness of breath. Left arm pain. Initial encounter. EXAM: CHEST  2 VIEW COMPARISON:  None. FINDINGS: The lungs are well-aerated. Mild peribronchial thickening is noted. There is no evidence of focal opacification, pleural effusion or pneumothorax. The heart is normal in size; the mediastinal contour is within normal limits. No acute osseous abnormalities are seen. IMPRESSION: Mild peribronchial thickening noted.  Lungs otherwise clear.  Electronically Signed   By: Garald Balding M.D.   On: 04/26/2015 01:24     CBC  Recent Labs Lab 04/26/15 0104  WBC 4.8  HGB 14.6  HCT 42.9  PLT 191  MCV 91.5  MCH 31.1  MCHC 34.0  RDW 13.1    Chemistries   Recent Labs Lab 04/26/15 0104  NA 143  K 3.8  CL 106  CO2 29  GLUCOSE 103*  BUN 21*  CREATININE 0.84  CALCIUM 10.1   ------------------------------------------------------------------------------------------------------------------ CrCl cannot be calculated (Unknown ideal weight.). ------------------------------------------------------------------------------------------------------------------ No results for input(s): HGBA1C in the last 72 hours. ------------------------------------------------------------------------------------------------------------------ No results for input(s): CHOL, HDL, LDLCALC, TRIG, CHOLHDL, LDLDIRECT in the last 72 hours. ------------------------------------------------------------------------------------------------------------------ No results for input(s): TSH, T4TOTAL, T3FREE, THYROIDAB in the last 72 hours.  Invalid input(s): FREET3 ------------------------------------------------------------------------------------------------------------------ No results for input(s): VITAMINB12, FOLATE, FERRITIN, TIBC, IRON, RETICCTPCT in the last 72 hours.  Coagulation profile No results for input(s): INR, PROTIME in the last 168 hours.  No results for input(s): DDIMER in the last 72 hours.  Cardiac Enzymes No results for input(s): CKMB, TROPONINI, MYOGLOBIN in the last 168 hours.  Invalid input(s): CK ------------------------------------------------------------------------------------------------------------------ Invalid input(s): POCBNP   CBG: No results for input(s): GLUCAP in the last 168 hours.     EKG: Independently reviewed. Sinus rhythm with rightward axis   Assessment/Plan Principal Problem:    Chest pain: Acute.  Chest pain symptoms given patient's previous history of hypertension, hyperlipidemia, and rheumatologic disease will admit for further evaluation. Could be cardiac versus acid reflux as patient complained as well of  intermittent belching.  - Admit to telemetry - Trending cardiac troponin  - Echocardiogram in a.m.  - Patient NPO  - Consult cardiology in a.m. for further evaluation and need of testing  Hyperlipidemia - check a lipid panel - continue fenofibrate  Crohn's disease: Stable  - continue mesalamine    esophageal reflux - Protonix    DVT prophylaxis Lovenox  Code Status:   full Family Communication: bedside Disposition Plan: admit   Total time spent 55 minutes.Greater than 50% of this time was spent in counseling, explanation of diagnosis, planning of further management, and coordination of care  [facility] Pager [phone]  If 7PM-7AM, please contact night-coverage [email or web address] Password [email or web address] 04/26/2015, 4:31 AM

## 2015-04-26 NOTE — ED Provider Notes (Signed)
CSN: [id number]     Arrival date & time 04/26/15  0029 History  By signing my name below, I, Altamease Oiler, attest that this documentation has been prepared under the direction and in the presence of Jola Schmidt, MD. Electronically Signed: Altamease Oiler, ED Scribe. 04/26/2015. 1:52 AM   Chief Complaint  Patient presents with  . Chest Pain   The history is provided by the patient. No language interpreter was used.   Sandra Yang is a 82 y.o. female with history of HTN, high cholesterol, and GERD who presents to the Emergency Department with her daughter complaining of intermittent, tight, left-sided chest pain with onset yesterday near 10 AM. Pt notes that the pain occasionally radiates to the left arm. The episodes last for approximately 30 minutes and have no known triggers or modifying factors. She is pain free at present. Associated symptoms include belching and SOB. Pt denies nausea, vomiting, and LE swelling. She smoked less than 1 PPD for 20 years but quit 40 years ago. No history of DM or DVT/PE.  Pt states that she last had a heart catheterization 30 years ago. Her PCP is Bevelyn Buckles.    Past Medical History  Diagnosis Date  . 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 Hypertension   . Crohn's colitis ([place]) 10/2008  . Allergy   . GERD (gastroesophageal reflux disease)    Past Surgical History  Procedure Laterality Date  . Hemicolectomy  2001  . Cholecystectomy  2001  . Vaginal hysterectomy    . Appendectomy    . Foot surgery    . Hand surgery     Family History  Problem Relation Age of Onset  . Prostate cancer Father   . Bone cancer Father   . Breast cancer Sister   . Colon cancer Neg Hx   . Stomach cancer Neg Hx    Social History  Substance Use Topics  . Smoking status: Former Smoker    Quit date: 04/05/1980  . Smokeless tobacco: Never Used  . Alcohol Use: 1.2 oz/week    2 Glasses of wine per week     Comment: socially   OB History    No  data available     Review of Systems 10 Systems reviewed and all are negative for acute change except as noted in the HPI. Allergies  Erythromycin; Tetanus toxoids; and Tape  Home Medications   Prior to Admission medications   Medication Sig Start Date End Date Taking? Authorizing Provider  aspirin 81 MG tablet Take 81 mg by mouth daily.      Historical Provider, MD  Cholecalciferol (VITAMIN D) 2000 UNITS CAPS Take by mouth daily.    Historical Provider, MD  fenofibrate 160 MG tablet Take 160 mg by mouth daily.      Historical Provider, MD  hyoscyamine (LEVSIN, ANASPAZ) 0.125 MG tablet PLACE 1-2 TABLETS UNDER TONGUE EVERY 4 HOURS AS NEEDED FOR ABDOMINAL PAIN 05/14/14   Ladene Artist, MD  LIALDA 1.2 g EC tablet TAKE 2 TABLETS BY MOUTH EVERY DAY WITH BREAKFAST 04/15/15   Ladene Artist, MD  Omega-3 Fatty Acids (FISH OIL) 1200 MG CAPS Take 1 capsule by mouth daily.      Historical Provider, MD  omeprazole (PRILOSEC) 20 MG capsule TAKE 1 CAPSULE BY MOUTH ONCE A DAY 02/18/15   Ladene Artist, MD  triamterene-hydrochlorothiazide (MAXZIDE-25) 37.5-25 MG per tablet Take 1 tablet by mouth daily.  11/09/11   Historical Provider, MD   BP  163/51 mmHg  Pulse 62  Temp(Src) 97.7 F (36.5 C) (Oral)  Resp 16  SpO2 98% Physical Exam  Constitutional: She is oriented to person, place, and time. She appears well-developed and well-nourished. No distress.  HENT:  Head: Normocephalic and atraumatic.  Eyes: EOM are normal.  Neck: Normal range of motion.  Cardiovascular: Normal rate, regular rhythm and normal heart sounds.   Pulmonary/Chest: Effort normal and breath sounds normal.  Abdominal: Soft. She exhibits no distension. There is no tenderness.  Musculoskeletal: Normal range of motion.  Neurological: She is alert and oriented to person, place, and time.  Skin: Skin is warm and dry.  Psychiatric: She has a normal mood and affect. Judgment normal.  Nursing note and vitals reviewed.   ED Course   Procedures (including critical care time) DIAGNOSTIC STUDIES: Oxygen Saturation is 98% on RA,  normal by my interpretation.    COORDINATION OF CARE: 1:44 AM Discussed treatment plan which includes lab work, CXR, EKG, and admission to the hospital with pt at bedside and pt agreed to plan.  Labs Review Labs Reviewed  BASIC METABOLIC PANEL - Abnormal; Notable for the following:    Glucose, Bld 103 (*)    BUN 21 (*)    All other components within normal limits  CBC  I-STAT TROPOININ, ED    Imaging Review Dg Chest 2 View  04/26/2015  CLINICAL DATA:  Acute onset of central and left-sided chest tightness. Shortness of breath. Left arm pain. Initial encounter. EXAM: CHEST  2 VIEW COMPARISON:  None. FINDINGS: The lungs are well-aerated. Mild peribronchial thickening is noted. There is no evidence of focal opacification, pleural effusion or pneumothorax. The heart is normal in size; the mediastinal contour is within normal limits. No acute osseous abnormalities are seen. IMPRESSION: Mild peribronchial thickening noted.  Lungs otherwise clear. Electronically Signed   By: Garald Balding M.D.   On: 04/26/2015 01:24   I have personally reviewed and evaluated these images and lab results as part of my medical decision-making.   EKG Interpretation   Date/Time:  Saturday April 26 2015 00:38:00 EST Ventricular Rate:  61 PR Interval:  184 QRS Duration: 72 QT Interval:  408 QTC Calculation: 410 R Axis:   107 Text Interpretation:  Normal sinus rhythm Rightward axis Low voltage QRS  Nonspecific ST abnormality Abnormal ECG nonspecific st changes as compared  to prior ecg Confirmed by Joniqua Sidle  MD, Lennette Bihari ([id number]) on 04/26/2015 1:44:41  AM      MDM   Final diagnoses:  None    Patient presenting with some typical cardiac symptoms of chest tightness and radiation her left arm with some associated shortness of breath.  Her symptoms been waxing and waning through the day.  Her EKG is without  ischemic changes.  Her initial troponin is negative.  She is currently chest pain-free.  I think she'll benefit from admission the hospital and serial enzymes plus or minus provocative testing.  Patient agrees to stay.  Aspirin now.  No active symptoms at this time.  I personally performed the services described in this documentation, which was scribed in my presence. The recorded information has been reviewed and is accurate.       Jola Schmidt, MD 04/26/15 [phone]

## 2015-04-27 ENCOUNTER — Observation Stay (HOSPITAL_COMMUNITY): Payer: Medicare HMO

## 2015-04-27 DIAGNOSIS — R9431 Abnormal electrocardiogram [ECG] [EKG]: Secondary | ICD-10-CM | POA: Diagnosis not present

## 2015-04-27 DIAGNOSIS — I10 Essential (primary) hypertension: Secondary | ICD-10-CM

## 2015-04-27 DIAGNOSIS — K50119 Crohn's disease of large intestine with unspecified complications: Secondary | ICD-10-CM | POA: Diagnosis not present

## 2015-04-27 DIAGNOSIS — K5090 Crohn's disease, unspecified, without complications: Secondary | ICD-10-CM | POA: Diagnosis not present

## 2015-04-27 DIAGNOSIS — K219 Gastro-esophageal reflux disease without esophagitis: Secondary | ICD-10-CM | POA: Diagnosis not present

## 2015-04-27 DIAGNOSIS — E785 Hyperlipidemia, unspecified: Secondary | ICD-10-CM | POA: Diagnosis not present

## 2015-04-27 DIAGNOSIS — I200 Unstable angina: Secondary | ICD-10-CM | POA: Diagnosis not present

## 2015-04-27 DIAGNOSIS — R69 Illness, unspecified: Secondary | ICD-10-CM | POA: Diagnosis not present

## 2015-04-27 DIAGNOSIS — R079 Chest pain, unspecified: Secondary | ICD-10-CM | POA: Diagnosis not present

## 2015-04-27 LAB — LIPID PANEL
CHOL/HDL RATIO: 3.2 ratio
CHOLESTEROL: 159 mg/dL (ref 0–200)
HDL: 50 mg/dL (ref >40)
LDL Cholesterol: 90 mg/dL (ref 0–99)
TRIGLYCERIDES: 93 mg/dL (ref <150)
VLDL: 19 mg/dL (ref 0–40)

## 2015-04-27 MED ORDER — REGADENOSON 0.4 MG/5ML IV SOLN
0.4000 mg | Freq: Once | INTRAVENOUS | Status: AC
  Administered 2015-04-27: 0.4 mg via INTRAVENOUS
  Filled 2015-04-27: qty 5

## 2015-04-27 MED ORDER — REGADENOSON 0.4 MG/5ML IV SOLN
INTRAVENOUS | Status: AC
  Filled 2015-04-27: qty 5

## 2015-04-27 MED ORDER — TECHNETIUM TC 99M SESTAMIBI - CARDIOLITE
30.0000 | Freq: Once | INTRAVENOUS | Status: AC | PRN
  Administered 2015-04-27: 30 via INTRAVENOUS

## 2015-04-27 MED ORDER — AMLODIPINE BESYLATE 5 MG PO TABS: 5.0000 mg | ORAL_TABLET | Freq: Every day | ORAL | Status: DC

## 2015-04-27 MED ORDER — TECHNETIUM TC 99M SESTAMIBI GENERIC - CARDIOLITE
10.0000 | Freq: Once | INTRAVENOUS | Status: AC | PRN
  Administered 2015-04-27: 10 via INTRAVENOUS

## 2015-04-27 NOTE — Progress Notes (Signed)
Nursing note Patient and family given discharge instructions medication list and patient prescription sent personal pharmacy will discharge home as ordered. Phinehas Grounds, Bettina Gavia RN

## 2015-04-27 NOTE — Progress Notes (Signed)
Subjective:  Patient denies any chest pain or shortness of breath. Scheduled for nuclear stress test today  Objective:  Vital Signs in the last 24 hours: Temp:  [97.5 F (36.4 C)-98 F (36.7 C)] 97.5 F (36.4 C) (01/22 0519) Pulse Rate:  [54-67] 58 (01/22 0519) Resp:  [18-19] 19 (01/22 0519) BP: (122-150)/(41-73) 150/45 mmHg (01/22 0924) SpO2:  [96 %-98 %] 98 % (01/22 0519)  Intake/Output from previous day: 01/21 0701 - 01/22 0700 In: 360 [P.O.:360] Out: -  Intake/Output from this shift:    Physical Exam: Neck: no adenopathy, no carotid bruit, no JVD and supple, symmetrical, trachea midline Lungs: clear to auscultation bilaterally Heart: regular rate and rhythm, S1, S2 normal and Soft systolic and diastolic murmur noted Abdomen: soft, non-tender; bowel sounds normal; no masses,  no organomegaly Extremities: extremities normal, atraumatic, no cyanosis or edema  Lab Results:  Recent Labs  04/26/15 0104  WBC 4.8  HGB 14.6  PLT 191    Recent Labs  04/26/15 0104  NA 143  K 3.8  CL 106  CO2 29  GLUCOSE 103*  BUN 21*  CREATININE 0.84    Recent Labs  04/26/15 1437 04/26/15 2122  TROPONINI <0.03 <0.03   Hepatic Function Panel No results for input(s): PROT, ALBUMIN, AST, ALT, ALKPHOS, BILITOT, BILIDIR, IBILI in the last 72 hours.  Recent Labs  04/27/15 0248  CHOL 159   No results for input(s): PROTIME in the last 72 hours.  Imaging: Imaging results have been reviewed and Dg Chest 2 View  04/26/2015  CLINICAL DATA:  Acute onset of central and left-sided chest tightness. Shortness of breath. Left arm pain. Initial encounter. EXAM: CHEST  2 VIEW COMPARISON:  None. FINDINGS: The lungs are well-aerated. Mild peribronchial thickening is noted. There is no evidence of focal opacification, pleural effusion or pneumothorax. The heart is normal in size; the mediastinal contour is within normal limits. No acute osseous abnormalities are seen. IMPRESSION: Mild  peribronchial thickening noted.  Lungs otherwise clear. Electronically Signed   By: Garald Balding M.D.   On: 04/26/2015 01:24    Cardiac Studies:  Assessment/Plan:  Unstable angina MI ruled out Hypertension Hyperlipidemia GERD Remote tobacco abuse History of Crohn's disease   plan Continue present management Scheduled for nuclear stress test today   Charolette Forward 04/27/2015, 10:39 AM

## 2015-04-27 NOTE — Discharge Summary (Signed)
Physician Discharge Summary  Sandra Yang KXF:[id number] DOB: [birth year] DOA: 04/26/2015  PCP: Donnajean Lopes, MD  Admit date: 04/26/2015 Discharge date: 04/27/2015  Time spent: 32 minutes  Recommendations for Outpatient Follow-up:  1. Needs close OP monitoring wth Cardiology Dr. Terrence Dupont 2. Follow up with pcp 3. Note changes to meds below 4. Consider chem-12 and cbc 1 wk  Discharge Diagnoses:  Principal Problem:   Chest pain Active Problems:   CROHN'S DISEASE-LARGE INTESTINE   Esophageal reflux   Hyperlipidemia   Essential hypertension   Discharge Condition: fair  Diet recommendation: hh low salt  Filed Weights   04/26/15 0500  Weight: 56.7 kg (125 lb)   Hospital Course:  82 y/o ? chronic Colitis IBS GERD Prior tubulivillous adenoma s/p distal ileal resection + cholecystectomy  Admitted with CP, no specific, some radaition donw L arm, no dizzy, no sob, no fever no chills Takes aspirin daily Non smoker Grandpa had heart disease  Heart score-5 Stress test lexis can performed showing no reversible ischemic, EF 79% and low risk findings  2/2 to bradycardia BB stopped and transitioned ot Norvasc 5 of.  D/c Maxzide  Patient updated and understands needs follow up   Discharge Exam: Filed Vitals:   04/27/15 1044 04/27/15 1339  BP: 143/44 131/40  Pulse: 63 53  Temp:  97.7 F (36.5 C)  Resp:    Alert pleasant oriented in nad Cp free  General: eomi ncat Cardiovascular:  s1 s2 no m/r/g Respiratory: clear no added sound  Discharge Instructions   Discharge Instructions    Diet - low sodium heart healthy    Complete by:  As directed      Discharge instructions    Complete by:  As directed   Your stress test was neg. Please follow up with Cardiology Dr. Terrence Dupont in 2 weeks Call him sooner if any problem or chest pain We have changed some of your meds-review list carefully     Increase activity slowly    Complete by:  As directed           Current  Discharge Medication List    START taking these medications   Details  amLODipine (NORVASC) 5 MG tablet Take 1 tablet (5 mg total) by mouth daily. Qty: 30 tablet, Refills: 0      CONTINUE these medications which have NOT CHANGED   Details  aspirin 81 MG tablet Take 81 mg by mouth daily.      Cholecalciferol (VITAMIN D) 2000 UNITS CAPS Take 2,000 Units by mouth daily.     fenofibrate 160 MG tablet Take 160 mg by mouth daily.      hyoscyamine (LEVSIN, ANASPAZ) 0.125 MG tablet PLACE 1-2 TABLETS UNDER TONGUE EVERY 4 HOURS AS NEEDED FOR ABDOMINAL PAIN Qty: 120 tablet, Refills: 11    LIALDA 1.2 g EC tablet TAKE 2 TABLETS BY MOUTH EVERY DAY WITH BREAKFAST Qty: 180 tablet, Refills: 1    triamterene-hydrochlorothiazide (MAXZIDE-25) 37.5-25 MG per tablet Take 1 tablet by mouth daily.       STOP taking these medications     Omega-3 Fatty Acids (FISH OIL) 1200 MG CAPS      omeprazole (PRILOSEC) 20 MG capsule        Allergies  Allergen Reactions  . Erythromycin Itching  . Tetanus Toxoids Swelling    Swelling of arm  . Tape Rash      The results of significant diagnostics from this hospitalization (including imaging, microbiology, ancillary and laboratory) are listed below for  reference.    Significant Diagnostic Studies: Dg Chest 2 View  04/26/2015  CLINICAL DATA:  Acute onset of central and left-sided chest tightness. Shortness of breath. Left arm pain. Initial encounter. EXAM: CHEST  2 VIEW COMPARISON:  None. FINDINGS: The lungs are well-aerated. Mild peribronchial thickening is noted. There is no evidence of focal opacification, pleural effusion or pneumothorax. The heart is normal in size; the mediastinal contour is within normal limits. No acute osseous abnormalities are seen. IMPRESSION: Mild peribronchial thickening noted.  Lungs otherwise clear. Electronically Signed   By: Garald Balding M.D.   On: 04/26/2015 01:24   Nm Myocar Multi W/spect W/wall Motion / Ef  04/27/2015   CLINICAL DATA:  Chest pain and hypertension. EXAM: MYOCARDIAL IMAGING WITH SPECT (REST AND PHARMACOLOGIC-STRESS) GATED LEFT VENTRICULAR WALL MOTION STUDY LEFT VENTRICULAR EJECTION FRACTION TECHNIQUE: Standard myocardial SPECT imaging was performed after resting intravenous injection of 10 mCi Tc-61msestamibi. Subsequently, intravenous infusion of Lexiscan was performed under the supervision of the Cardiology staff. At peak effect of the drug, 30 mCi Tc-960mestamibi was injected intravenously and standard myocardial SPECT imaging was performed. Quantitative gated imaging was also performed to evaluate left ventricular wall motion, and estimate left ventricular ejection fraction. COMPARISON:  None. FINDINGS: Perfusion: No decreased activity in the left ventricle on stress imaging to suggest reversible ischemia or infarction. Wall Motion: Normal left ventricular wall motion. No left ventricular dilation. Left Ventricular Ejection Fraction: 79 % End diastolic volume 38 ml End systolic volume 8 ml IMPRESSION: 1. No evidence of inducible myocardial ischemia or scar. 2. Normal left ventricular wall motion. 3. Left ventricular ejection fraction 79% 4. Low-risk stress test findings*. *2012 Appropriate Use Criteria for Coronary Revascularization Focused Update: J Am Coll Cardiol. 202542;70(6):237-628http://content.onairportbarriers.comspx?articleid=[email or web address] Electronically Signed   By: GlAletta Edouard.D.   On: 04/27/2015 13:29    Microbiology: No results found for this or any previous visit (from the past 240 hour(s)).   Labs: Basic Metabolic Panel:  Recent Labs Lab 04/26/15 0104  NA 143  K 3.8  CL 106  CO2 29  GLUCOSE 103*  BUN 21*  CREATININE 0.84  CALCIUM 10.1   Liver Function Tests: No results for input(s): AST, ALT, ALKPHOS, BILITOT, PROT, ALBUMIN in the last 168 hours. No results for input(s): LIPASE, AMYLASE in the last 168 hours. No results for input(s): AMMONIA in the last 168  hours. CBC:  Recent Labs Lab 04/26/15 0104  WBC 4.8  HGB 14.6  HCT 42.9  MCV 91.5  PLT 191   Cardiac Enzymes:  Recent Labs Lab 04/26/15 1102 04/26/15 1437 04/26/15 2122  TROPONINI <0.03 <0.03 <0.03   BNP: BNP (last 3 results) No results for input(s): BNP in the last 8760 hours.  ProBNP (last 3 results) No results for input(s): PROBNP in the last 8760 hours.  CBG: No results for input(s): GLUCAP in the last 168 hours.     Signed: Nita SellsD   [name] 04/27/2015, 1:51 PM

## 2015-04-28 LAB — NM MYOCAR MULTI W/SPECT W/WALL MOTION / EF
CHL CUP STRESS STAGE 1 SPEED: 0 mph
CHL CUP STRESS STAGE 3 GRADE: 0 %
CHL CUP STRESS STAGE 3 SBP: 143 mmHg
CHL CUP STRESS STAGE 3 SPEED: 0 mph
CHL CUP STRESS STAGE 4 HR: 79 {beats}/min
CHL CUP STRESS STAGE 4 SPEED: 0 mph
CHL CUP STRESS STAGE 5 DBP: 47 mmHg
CHL CUP STRESS STAGE 5 GRADE: 0 %
CHL CUP STRESS STAGE 5 HR: 81 {beats}/min
CHL CUP STRESS STAGE 6 DBP: 45 mmHg
CHL CUP STRESS STAGE 6 HR: 75 {beats}/min
CHL CUP STRESS STAGE 6 SBP: 150 mmHg
CSEPEW: 1 METS
CSEPPHR: 75 {beats}/min
Peak BP: 150 mmHg
Percent of predicted max HR: 53 %
Stage 1 DBP: 58 mmHg
Stage 1 Grade: 0 %
Stage 1 HR: 57 {beats}/min
Stage 1 SBP: 150 mmHg
Stage 2 Grade: 0 %
Stage 2 HR: 57 {beats}/min
Stage 2 Speed: 0 mph
Stage 3 DBP: 44 mmHg
Stage 3 HR: 84 {beats}/min
Stage 4 DBP: 44 mmHg
Stage 4 Grade: 0 %
Stage 4 SBP: 147 mmHg
Stage 5 SBP: 145 mmHg
Stage 5 Speed: 0 mph
Stage 6 Grade: 0 %
Stage 6 Speed: 0 mph

## 2015-05-12 DIAGNOSIS — I10 Essential (primary) hypertension: Secondary | ICD-10-CM | POA: Diagnosis not present

## 2015-05-12 DIAGNOSIS — K219 Gastro-esophageal reflux disease without esophagitis: Secondary | ICD-10-CM | POA: Diagnosis not present

## 2015-05-12 DIAGNOSIS — R69 Illness, unspecified: Secondary | ICD-10-CM | POA: Diagnosis not present

## 2015-05-12 DIAGNOSIS — E785 Hyperlipidemia, unspecified: Secondary | ICD-10-CM | POA: Diagnosis not present

## 2015-06-02 ENCOUNTER — Encounter: Payer: Self-pay | Admitting: Gastroenterology

## 2015-06-02 ENCOUNTER — Ambulatory Visit (INDEPENDENT_AMBULATORY_CARE_PROVIDER_SITE_OTHER): Payer: Medicare HMO | Admitting: Gastroenterology

## 2015-06-02 VITALS — BP 124/62 | HR 72 | Ht 60.0 in | Wt 132.4 lb

## 2015-06-02 DIAGNOSIS — K219 Gastro-esophageal reflux disease without esophagitis: Secondary | ICD-10-CM

## 2015-06-02 DIAGNOSIS — K5010 Crohn's disease of large intestine without complications: Secondary | ICD-10-CM | POA: Diagnosis not present

## 2015-06-02 DIAGNOSIS — R079 Chest pain, unspecified: Secondary | ICD-10-CM | POA: Diagnosis not present

## 2015-06-02 MED ORDER — OMEPRAZOLE 20 MG PO CPDR: 20.0000 mg | DELAYED_RELEASE_CAPSULE | Freq: Every day | ORAL | Status: DC

## 2015-06-02 NOTE — Patient Instructions (Signed)
We have sent the following medications to your pharmacy for you to pick up at your convenience:omeprazole.  Thank you for choosing me and [facility].  Pricilla Riffle. Dagoberto Ligas., MD., Marval Regal

## 2015-06-02 NOTE — Progress Notes (Signed)
    History of Present Illness: This is a 82 year old white female with chest pain and pressure. She was recently hospitalized for chest pain associated with left arm numbness and belching. I reviewed the notes from that hospitalization. Cardiac evaluation was negative including a NM SPECT study showing no evidence of myocardial ischemia or scar, no wall motion abnormalities and a normal ejection fraction. Her symptoms resolved and she currently has no chest pain. She does note that she belches a little more frequently than she did in the past. No other GERD symptoms. She takes omeprazole 20 mg every day.  Current Medications, Allergies, Past Medical History, Past Surgical History, Family History and Social History were reviewed in [employer] record.  Physical Exam: General: Well developed, well nourished, no acute distress Head: Normocephalic and atraumatic Eyes:  sclerae anicteric, EOMI Ears: Normal auditory acuity Mouth: No deformity or lesions Lungs: Clear throughout to auscultation Heart: Regular rate and rhythm; no murmurs, rubs or bruits Abdomen: Soft, non tender and non distended. No masses, hepatosplenomegaly or hernias noted. Normal Bowel sounds Musculoskeletal: Symmetrical with no gross deformities  Pulses:  Normal pulses noted Extremities: No clubbing, cyanosis, edema or deformities noted Neurological: Alert oriented x 4, grossly nonfocal Psychological:  Alert and cooperative. Normal mood and affect  Assessment and Recommendations:  1. Chest pain and belching. Suspected GERD flare. Continue omeprazole 20 mg po qam. If GERD symptoms flare again will change to bid or 40 mg qam.   2. Crohn's colitis. Recent colonoscopy did not show any endoscopic evidence of activity. Continue Lialda 2.4 g daily.   3. IBS, currently inactive. Levsin q4h prn.   4. Personal history of an advanced adenoma. Recent colonoscopy showed 3 small adenomatous colon polyps. Given her  age no future surveillance colonoscopies are planned.

## 2015-06-10 ENCOUNTER — Other Ambulatory Visit: Payer: Self-pay

## 2015-06-10 DIAGNOSIS — Z1231 Encounter for screening mammogram for malignant neoplasm of breast: Secondary | ICD-10-CM

## 2015-06-26 ENCOUNTER — Inpatient Hospital Stay: Admission: RE | Admit: 2015-06-26 | Payer: Medicare HMO | Source: Ambulatory Visit

## 2015-06-30 ENCOUNTER — Other Ambulatory Visit: Payer: Self-pay | Admitting: Gastroenterology

## 2015-07-03 DIAGNOSIS — Z0100 Encounter for examination of eyes and vision without abnormal findings: Secondary | ICD-10-CM | POA: Diagnosis not present

## 2015-07-03 DIAGNOSIS — H52223 Regular astigmatism, bilateral: Secondary | ICD-10-CM | POA: Diagnosis not present

## 2015-07-03 DIAGNOSIS — H5203 Hypermetropia, bilateral: Secondary | ICD-10-CM | POA: Diagnosis not present

## 2015-07-15 DIAGNOSIS — M79674 Pain in right toe(s): Secondary | ICD-10-CM | POA: Diagnosis not present

## 2015-07-15 DIAGNOSIS — L84 Corns and callosities: Secondary | ICD-10-CM | POA: Diagnosis not present

## 2015-07-17 ENCOUNTER — Ambulatory Visit
Admission: RE | Admit: 2015-07-17 | Discharge: 2015-07-17 | Disposition: A | Discharge status: Home / Self Care | Payer: Medicare HMO | Source: Ambulatory Visit

## 2015-07-17 DIAGNOSIS — K219 Gastro-esophageal reflux disease without esophagitis: Secondary | ICD-10-CM | POA: Diagnosis not present

## 2015-07-17 DIAGNOSIS — K5090 Crohn's disease, unspecified, without complications: Secondary | ICD-10-CM | POA: Diagnosis not present

## 2015-07-17 DIAGNOSIS — E785 Hyperlipidemia, unspecified: Secondary | ICD-10-CM | POA: Diagnosis not present

## 2015-07-17 DIAGNOSIS — R69 Illness, unspecified: Secondary | ICD-10-CM | POA: Diagnosis not present

## 2015-07-17 DIAGNOSIS — I10 Essential (primary) hypertension: Secondary | ICD-10-CM | POA: Diagnosis not present

## 2015-07-17 DIAGNOSIS — Z1231 Encounter for screening mammogram for malignant neoplasm of breast: Secondary | ICD-10-CM | POA: Diagnosis not present

## 2015-07-17 DIAGNOSIS — R609 Edema, unspecified: Secondary | ICD-10-CM | POA: Diagnosis not present

## 2015-08-26 DIAGNOSIS — R69 Illness, unspecified: Secondary | ICD-10-CM | POA: Diagnosis not present

## 2015-09-18 DIAGNOSIS — D487 Neoplasm of uncertain behavior of other specified sites: Secondary | ICD-10-CM | POA: Diagnosis not present

## 2015-09-18 DIAGNOSIS — D2372 Other benign neoplasm of skin of left lower limb, including hip: Secondary | ICD-10-CM | POA: Diagnosis not present

## 2015-09-18 DIAGNOSIS — Z85828 Personal history of other malignant neoplasm of skin: Secondary | ICD-10-CM | POA: Diagnosis not present

## 2015-09-18 DIAGNOSIS — D1800 Hemangioma unspecified site: Secondary | ICD-10-CM | POA: Diagnosis not present

## 2015-09-18 DIAGNOSIS — L821 Other seborrheic keratosis: Secondary | ICD-10-CM | POA: Diagnosis not present

## 2015-09-18 DIAGNOSIS — D485 Neoplasm of uncertain behavior of skin: Secondary | ICD-10-CM | POA: Diagnosis not present

## 2015-10-06 DIAGNOSIS — I10 Essential (primary) hypertension: Secondary | ICD-10-CM | POA: Diagnosis not present

## 2015-10-06 DIAGNOSIS — J3489 Other specified disorders of nose and nasal sinuses: Secondary | ICD-10-CM | POA: Diagnosis not present

## 2015-10-06 DIAGNOSIS — Z6826 Body mass index (BMI) 26.0-26.9, adult: Secondary | ICD-10-CM | POA: Diagnosis not present

## 2015-10-15 DIAGNOSIS — J310 Chronic rhinitis: Secondary | ICD-10-CM | POA: Diagnosis not present

## 2015-10-15 DIAGNOSIS — R220 Localized swelling, mass and lump, head: Secondary | ICD-10-CM | POA: Diagnosis not present

## 2015-10-24 DIAGNOSIS — I10 Essential (primary) hypertension: Secondary | ICD-10-CM | POA: Diagnosis not present

## 2015-10-24 DIAGNOSIS — E785 Hyperlipidemia, unspecified: Secondary | ICD-10-CM | POA: Diagnosis not present

## 2015-10-24 DIAGNOSIS — R69 Illness, unspecified: Secondary | ICD-10-CM | POA: Diagnosis not present

## 2015-10-24 DIAGNOSIS — K5090 Crohn's disease, unspecified, without complications: Secondary | ICD-10-CM | POA: Diagnosis not present

## 2015-10-24 DIAGNOSIS — K219 Gastro-esophageal reflux disease without esophagitis: Secondary | ICD-10-CM | POA: Diagnosis not present

## 2015-12-04 DIAGNOSIS — H25013 Cortical age-related cataract, bilateral: Secondary | ICD-10-CM | POA: Diagnosis not present

## 2015-12-04 DIAGNOSIS — H2513 Age-related nuclear cataract, bilateral: Secondary | ICD-10-CM | POA: Diagnosis not present

## 2015-12-04 DIAGNOSIS — Z0100 Encounter for examination of eyes and vision without abnormal findings: Secondary | ICD-10-CM | POA: Diagnosis not present

## 2015-12-09 DIAGNOSIS — R8299 Other abnormal findings in urine: Secondary | ICD-10-CM | POA: Diagnosis not present

## 2015-12-09 DIAGNOSIS — E784 Other hyperlipidemia: Secondary | ICD-10-CM | POA: Diagnosis not present

## 2015-12-09 DIAGNOSIS — N390 Urinary tract infection, site not specified: Secondary | ICD-10-CM | POA: Diagnosis not present

## 2015-12-09 DIAGNOSIS — I10 Essential (primary) hypertension: Secondary | ICD-10-CM | POA: Diagnosis not present

## 2015-12-15 DIAGNOSIS — Z0000 Encounter for general adult medical examination without abnormal findings: Secondary | ICD-10-CM | POA: Diagnosis not present

## 2015-12-15 DIAGNOSIS — I10 Essential (primary) hypertension: Secondary | ICD-10-CM | POA: Diagnosis not present

## 2015-12-15 DIAGNOSIS — Z23 Encounter for immunization: Secondary | ICD-10-CM | POA: Diagnosis not present

## 2015-12-15 DIAGNOSIS — Z1389 Encounter for screening for other disorder: Secondary | ICD-10-CM | POA: Diagnosis not present

## 2015-12-15 DIAGNOSIS — K5010 Crohn's disease of large intestine without complications: Secondary | ICD-10-CM | POA: Diagnosis not present

## 2015-12-15 DIAGNOSIS — R808 Other proteinuria: Secondary | ICD-10-CM | POA: Diagnosis not present

## 2015-12-15 DIAGNOSIS — N182 Chronic kidney disease, stage 2 (mild): Secondary | ICD-10-CM | POA: Diagnosis not present

## 2015-12-15 DIAGNOSIS — E784 Other hyperlipidemia: Secondary | ICD-10-CM | POA: Diagnosis not present

## 2015-12-15 DIAGNOSIS — Z6825 Body mass index (BMI) 25.0-25.9, adult: Secondary | ICD-10-CM | POA: Diagnosis not present

## 2015-12-16 DIAGNOSIS — L821 Other seborrheic keratosis: Secondary | ICD-10-CM | POA: Diagnosis not present

## 2015-12-16 DIAGNOSIS — D485 Neoplasm of uncertain behavior of skin: Secondary | ICD-10-CM | POA: Diagnosis not present

## 2015-12-16 DIAGNOSIS — D487 Neoplasm of uncertain behavior of other specified sites: Secondary | ICD-10-CM | POA: Diagnosis not present

## 2015-12-23 DIAGNOSIS — R69 Illness, unspecified: Secondary | ICD-10-CM | POA: Diagnosis not present

## 2015-12-30 DIAGNOSIS — H2512 Age-related nuclear cataract, left eye: Secondary | ICD-10-CM | POA: Diagnosis not present

## 2015-12-30 DIAGNOSIS — H25012 Cortical age-related cataract, left eye: Secondary | ICD-10-CM | POA: Diagnosis not present

## 2015-12-30 DIAGNOSIS — H25812 Combined forms of age-related cataract, left eye: Secondary | ICD-10-CM | POA: Diagnosis not present

## 2016-02-03 DIAGNOSIS — H25811 Combined forms of age-related cataract, right eye: Secondary | ICD-10-CM | POA: Diagnosis not present

## 2016-02-03 DIAGNOSIS — H2511 Age-related nuclear cataract, right eye: Secondary | ICD-10-CM | POA: Diagnosis not present

## 2016-02-03 DIAGNOSIS — H25011 Cortical age-related cataract, right eye: Secondary | ICD-10-CM | POA: Diagnosis not present

## 2016-02-06 DIAGNOSIS — I10 Essential (primary) hypertension: Secondary | ICD-10-CM | POA: Diagnosis not present

## 2016-02-06 DIAGNOSIS — Z681 Body mass index (BMI) 19 or less, adult: Secondary | ICD-10-CM | POA: Diagnosis not present

## 2016-02-06 DIAGNOSIS — K219 Gastro-esophageal reflux disease without esophagitis: Secondary | ICD-10-CM | POA: Diagnosis not present

## 2016-02-06 DIAGNOSIS — E7800 Pure hypercholesterolemia, unspecified: Secondary | ICD-10-CM | POA: Diagnosis not present

## 2016-02-06 DIAGNOSIS — Z0000 Encounter for general adult medical examination without abnormal findings: Secondary | ICD-10-CM | POA: Diagnosis not present

## 2016-02-23 DIAGNOSIS — I10 Essential (primary) hypertension: Secondary | ICD-10-CM | POA: Diagnosis not present

## 2016-04-20 IMAGING — DX DG CHEST 2V
2 series · 2 of 2 positions shown · non-contrast
Comparison: None.

CLINICAL DATA: Acute onset of central and left-sided chest
tightness. Shortness of breath. Left arm pain. Initial encounter.

EXAM:
CHEST  2 VIEW

[chest pa]
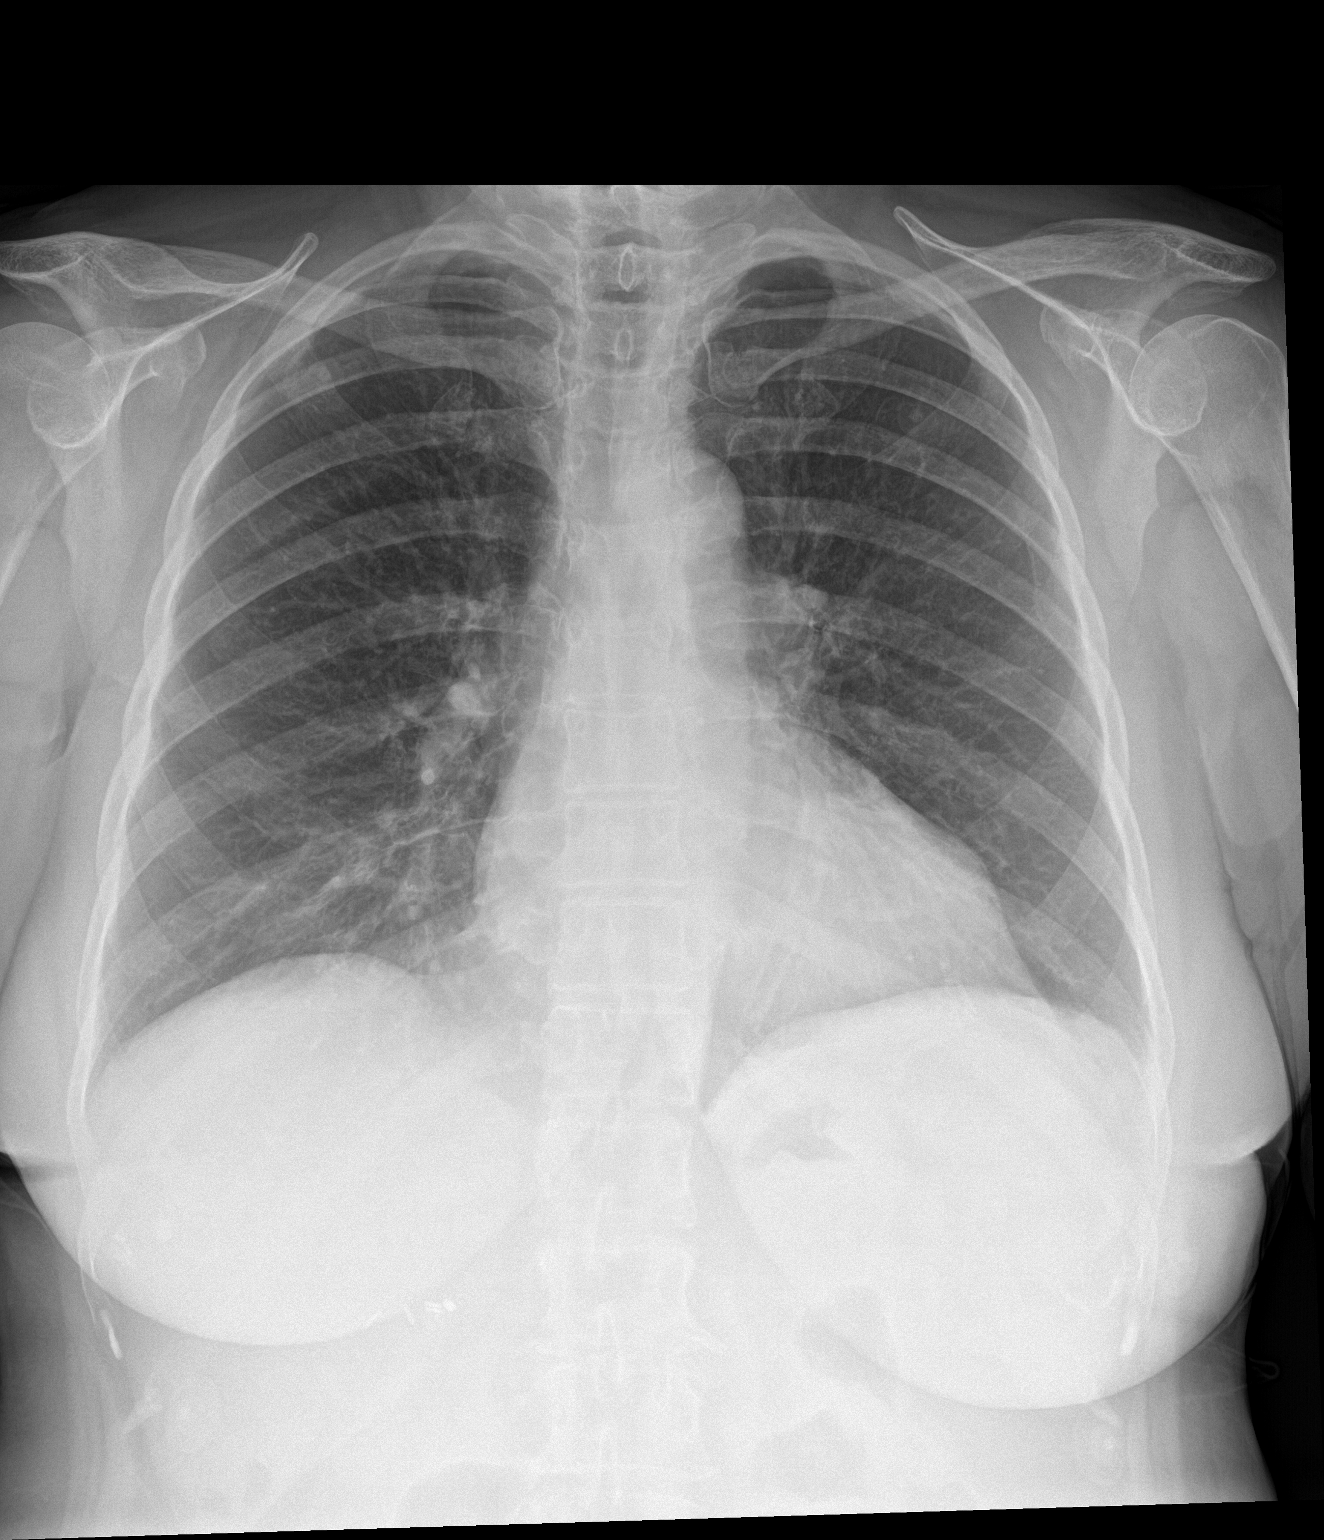

[chest lat]
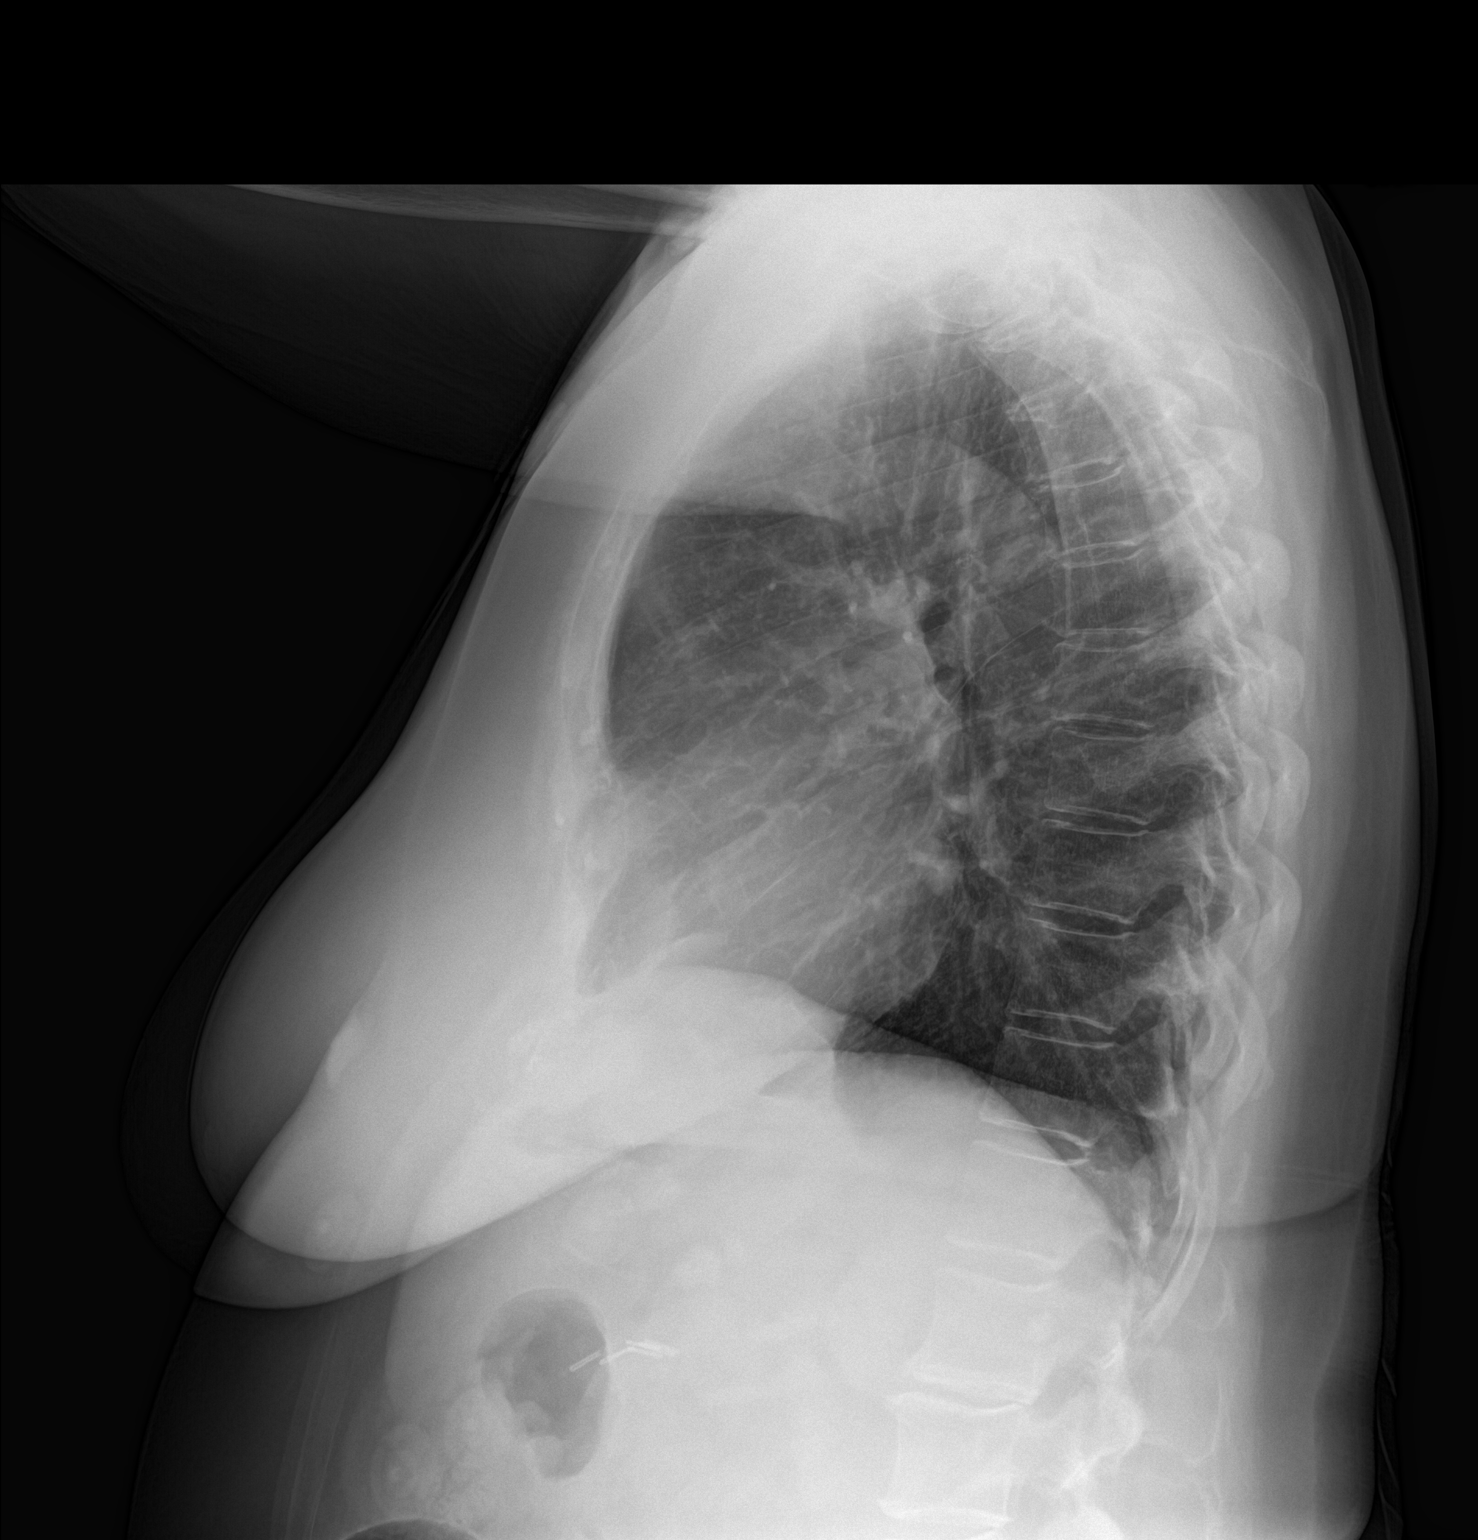

[2 of 2 positions shown; findings below may reference images not displayed]

FINDINGS: The lungs are well-aerated. Mild peribronchial thickening is noted.
There is no evidence of focal opacification, pleural effusion or
pneumothorax.

The heart is normal in size; the mediastinal contour is within
normal limits. No acute osseous abnormalities are seen.
IMPRESSION: Mild peribronchial thickening noted.  Lungs otherwise clear.

## 2016-05-04 ENCOUNTER — Other Ambulatory Visit: Payer: Self-pay | Admitting: Gastroenterology

## 2016-05-06 ENCOUNTER — Telehealth: Payer: Self-pay | Admitting: Gastroenterology

## 2016-05-06 MED ORDER — MESALAMINE 1.2 G PO TBEC: DELAYED_RELEASE_TABLET | ORAL | 0 refills | Status: DC

## 2016-05-06 NOTE — Telephone Encounter (Signed)
Prescription sent to patient's pharmacy and patient notified to keep appt for further refills.

## 2016-05-31 ENCOUNTER — Other Ambulatory Visit: Payer: Self-pay | Admitting: Gastroenterology

## 2016-06-17 ENCOUNTER — Encounter: Payer: Self-pay | Admitting: Gastroenterology

## 2016-06-17 ENCOUNTER — Ambulatory Visit (INDEPENDENT_AMBULATORY_CARE_PROVIDER_SITE_OTHER): Payer: Medicare HMO | Admitting: Gastroenterology

## 2016-06-17 VITALS — BP 124/64 | HR 68 | Ht 59.75 in | Wt 133.4 lb

## 2016-06-17 DIAGNOSIS — K580 Irritable bowel syndrome with diarrhea: Secondary | ICD-10-CM

## 2016-06-17 DIAGNOSIS — K219 Gastro-esophageal reflux disease without esophagitis: Secondary | ICD-10-CM

## 2016-06-17 DIAGNOSIS — K5010 Crohn's disease of large intestine without complications: Secondary | ICD-10-CM

## 2016-06-17 MED ORDER — MESALAMINE 1.2 G PO TBEC: DELAYED_RELEASE_TABLET | ORAL | 3 refills | Status: DC

## 2016-06-17 MED ORDER — HYOSCYAMINE SULFATE 0.125 MG PO TABS: ORAL_TABLET | ORAL | 3 refills | Status: DC

## 2016-06-17 MED ORDER — OMEPRAZOLE 20 MG PO CPDR: 20.0000 mg | DELAYED_RELEASE_CAPSULE | Freq: Every day | ORAL | 3 refills | Status: DC

## 2016-06-17 NOTE — Patient Instructions (Addendum)
We have sent the following medications to your pharmacy for you to pick up at your convenience:Lialda, omeprazole, and levsin.   You can use Gas-X four times a day for gas and bloating.  Thank you for choosing me and [facility].  Pricilla Riffle. Dagoberto Ligas., MD., Marval Regal

## 2016-06-17 NOTE — Progress Notes (Signed)
    History of Present Illness: This is an 83 year old female with a history of Crohn's colitis, GERD, IBS and a history of adenomatous colon polyps. She has frequent problems with postmeal fecal urgency and frequent bloating and discomfort. She uses hyoscyamine infrequently but it is helpful for her symptoms. She states her urgency and loose stools following meals has improved somewhat over the past several months. Reflux symptoms are under good control on current regimen. She states that her PCP, Leanna Battles, MD, obtains routine blood work on a regular basis and she is not aware of any abnormalities.  Colonoscopy IMPRESSION 03/2015: 1. Four sessile polyps in the descending colon; polypectomies performed with a cold snare-3 were tubular adenomas 2. Prior surgical anastomosis in the ascending colon  Current Medications, Allergies, Past Medical History, Past Surgical History, Family History and Social History were reviewed in [employer] record.  Physical Exam: General: Well developed, well nourished, no acute distress Head: Normocephalic and atraumatic Eyes:  sclerae anicteric, EOMI Ears: Normal auditory acuity Mouth: No deformity or lesions Lungs: Clear throughout to auscultation Heart: Regular rate and rhythm; no murmurs, rubs or bruits Abdomen: Soft, non tender and non distended. No masses, hepatosplenomegaly or hernias noted. Normal Bowel sounds Musculoskeletal: Symmetrical with no gross deformities  Pulses:  Normal pulses noted Extremities: No clubbing, cyanosis, edema or deformities noted Neurological: Alert oriented x 4, grossly nonfocal Psychological:  Alert and cooperative. Normal mood and affect  Assessment and Recommendations:  1. Crohn's colitis. Clinically quiescent. Refill Lialda 2.4 g po qd. Request blood work from Dr. Shon Baton office.   2. IBS. Continue hyoscyamine 1-2 sublingual when necessary abdominal pain, abdominal bloating, urgency.  Symptoms encouraged her to use hyoscyamine more frequently since it provides significant benefit. Gas-X qid prn.  Avoid or minimize foods that trigger symptoms.  3. GERD.  Continue standard antireflux measures and omeprazole 20 mg daily.

## 2016-08-23 DIAGNOSIS — K219 Gastro-esophageal reflux disease without esophagitis: Secondary | ICD-10-CM | POA: Diagnosis not present

## 2016-08-23 DIAGNOSIS — E785 Hyperlipidemia, unspecified: Secondary | ICD-10-CM | POA: Diagnosis not present

## 2016-08-23 DIAGNOSIS — R69 Illness, unspecified: Secondary | ICD-10-CM | POA: Diagnosis not present

## 2016-08-23 DIAGNOSIS — K5090 Crohn's disease, unspecified, without complications: Secondary | ICD-10-CM | POA: Diagnosis not present

## 2016-08-23 DIAGNOSIS — I10 Essential (primary) hypertension: Secondary | ICD-10-CM | POA: Diagnosis not present

## 2016-09-30 DIAGNOSIS — Z808 Family history of malignant neoplasm of other organs or systems: Secondary | ICD-10-CM | POA: Diagnosis not present

## 2016-09-30 DIAGNOSIS — Z85828 Personal history of other malignant neoplasm of skin: Secondary | ICD-10-CM | POA: Diagnosis not present

## 2016-09-30 DIAGNOSIS — D2372 Other benign neoplasm of skin of left lower limb, including hip: Secondary | ICD-10-CM | POA: Diagnosis not present

## 2016-09-30 DIAGNOSIS — L821 Other seborrheic keratosis: Secondary | ICD-10-CM | POA: Diagnosis not present

## 2016-09-30 DIAGNOSIS — D1800 Hemangioma unspecified site: Secondary | ICD-10-CM | POA: Diagnosis not present

## 2016-10-28 DIAGNOSIS — M26623 Arthralgia of bilateral temporomandibular joint: Secondary | ICD-10-CM | POA: Diagnosis not present

## 2016-10-28 DIAGNOSIS — R430 Anosmia: Secondary | ICD-10-CM | POA: Diagnosis not present

## 2016-10-28 DIAGNOSIS — H6983 Other specified disorders of Eustachian tube, bilateral: Secondary | ICD-10-CM | POA: Diagnosis not present

## 2016-11-29 ENCOUNTER — Other Ambulatory Visit: Payer: Self-pay | Admitting: Internal Medicine

## 2016-11-29 DIAGNOSIS — Z1231 Encounter for screening mammogram for malignant neoplasm of breast: Secondary | ICD-10-CM

## 2016-12-01 DIAGNOSIS — H43392 Other vitreous opacities, left eye: Secondary | ICD-10-CM | POA: Diagnosis not present

## 2016-12-13 DIAGNOSIS — E784 Other hyperlipidemia: Secondary | ICD-10-CM | POA: Diagnosis not present

## 2016-12-13 DIAGNOSIS — I10 Essential (primary) hypertension: Secondary | ICD-10-CM | POA: Diagnosis not present

## 2016-12-16 ENCOUNTER — Ambulatory Visit: Payer: Medicare HMO

## 2016-12-16 DIAGNOSIS — Z0000 Encounter for general adult medical examination without abnormal findings: Secondary | ICD-10-CM | POA: Diagnosis not present

## 2016-12-16 DIAGNOSIS — K5010 Crohn's disease of large intestine without complications: Secondary | ICD-10-CM | POA: Diagnosis not present

## 2016-12-16 DIAGNOSIS — Z6825 Body mass index (BMI) 25.0-25.9, adult: Secondary | ICD-10-CM | POA: Diagnosis not present

## 2016-12-16 DIAGNOSIS — I10 Essential (primary) hypertension: Secondary | ICD-10-CM | POA: Diagnosis not present

## 2016-12-16 DIAGNOSIS — R252 Cramp and spasm: Secondary | ICD-10-CM | POA: Diagnosis not present

## 2016-12-16 DIAGNOSIS — N182 Chronic kidney disease, stage 2 (mild): Secondary | ICD-10-CM | POA: Diagnosis not present

## 2016-12-16 DIAGNOSIS — Z23 Encounter for immunization: Secondary | ICD-10-CM | POA: Diagnosis not present

## 2016-12-16 DIAGNOSIS — E784 Other hyperlipidemia: Secondary | ICD-10-CM | POA: Diagnosis not present

## 2016-12-16 DIAGNOSIS — Z1389 Encounter for screening for other disorder: Secondary | ICD-10-CM | POA: Diagnosis not present

## 2016-12-24 DIAGNOSIS — Z1212 Encounter for screening for malignant neoplasm of rectum: Secondary | ICD-10-CM | POA: Diagnosis not present

## 2017-01-11 ENCOUNTER — Ambulatory Visit: Payer: Medicare HMO

## 2017-01-20 ENCOUNTER — Ambulatory Visit
Admission: RE | Admit: 2017-01-20 | Discharge: 2017-01-20 | Disposition: A | Discharge status: Home / Self Care | Payer: Medicare HMO | Source: Ambulatory Visit | Attending: Internal Medicine | Admitting: Internal Medicine

## 2017-01-20 DIAGNOSIS — Z1231 Encounter for screening mammogram for malignant neoplasm of breast: Secondary | ICD-10-CM

## 2017-01-21 ENCOUNTER — Other Ambulatory Visit: Payer: Self-pay | Admitting: Internal Medicine

## 2017-01-21 DIAGNOSIS — R928 Other abnormal and inconclusive findings on diagnostic imaging of breast: Secondary | ICD-10-CM

## 2017-01-27 ENCOUNTER — Ambulatory Visit
Admission: RE | Admit: 2017-01-27 | Discharge: 2017-01-27 | Disposition: A | Discharge status: Home / Self Care | Payer: Medicare HMO | Source: Ambulatory Visit | Attending: Internal Medicine | Admitting: Internal Medicine

## 2017-01-27 ENCOUNTER — Other Ambulatory Visit: Payer: Self-pay | Admitting: Internal Medicine

## 2017-01-27 DIAGNOSIS — R921 Mammographic calcification found on diagnostic imaging of breast: Secondary | ICD-10-CM | POA: Diagnosis not present

## 2017-01-27 DIAGNOSIS — R928 Other abnormal and inconclusive findings on diagnostic imaging of breast: Secondary | ICD-10-CM

## 2017-02-02 ENCOUNTER — Ambulatory Visit
Admission: RE | Admit: 2017-02-02 | Discharge: 2017-02-02 | Disposition: A | Discharge status: Home / Self Care | Payer: Medicare HMO | Source: Ambulatory Visit | Attending: Internal Medicine | Admitting: Internal Medicine

## 2017-02-02 DIAGNOSIS — N6091 Unspecified benign mammary dysplasia of right breast: Secondary | ICD-10-CM | POA: Diagnosis not present

## 2017-02-02 DIAGNOSIS — R921 Mammographic calcification found on diagnostic imaging of breast: Secondary | ICD-10-CM

## 2017-02-18 ENCOUNTER — Other Ambulatory Visit: Payer: Self-pay | Admitting: General Surgery

## 2017-02-18 DIAGNOSIS — N6489 Other specified disorders of breast: Secondary | ICD-10-CM | POA: Diagnosis not present

## 2017-03-03 ENCOUNTER — Other Ambulatory Visit: Payer: Self-pay | Admitting: General Surgery

## 2017-03-03 DIAGNOSIS — N6489 Other specified disorders of breast: Secondary | ICD-10-CM

## 2017-04-05 HISTORY — PX: BREAST EXCISIONAL BIOPSY: SUR124

## 2017-04-06 ENCOUNTER — Encounter (INDEPENDENT_AMBULATORY_CARE_PROVIDER_SITE_OTHER): Payer: Self-pay

## 2017-04-06 ENCOUNTER — Ambulatory Visit: Payer: Medicare HMO | Admitting: Physician Assistant

## 2017-04-06 ENCOUNTER — Encounter: Payer: Self-pay | Admitting: Physician Assistant

## 2017-04-06 ENCOUNTER — Other Ambulatory Visit (INDEPENDENT_AMBULATORY_CARE_PROVIDER_SITE_OTHER): Payer: Medicare HMO

## 2017-04-06 VITALS — BP 118/54 | HR 60 | Ht 59.0 in | Wt 129.6 lb

## 2017-04-06 DIAGNOSIS — K5010 Crohn's disease of large intestine without complications: Secondary | ICD-10-CM

## 2017-04-06 DIAGNOSIS — K219 Gastro-esophageal reflux disease without esophagitis: Secondary | ICD-10-CM

## 2017-04-06 DIAGNOSIS — R1084 Generalized abdominal pain: Secondary | ICD-10-CM | POA: Diagnosis not present

## 2017-04-06 DIAGNOSIS — R110 Nausea: Secondary | ICD-10-CM

## 2017-04-06 DIAGNOSIS — R140 Abdominal distension (gaseous): Secondary | ICD-10-CM | POA: Diagnosis not present

## 2017-04-06 LAB — CBC WITH DIFFERENTIAL/PLATELET
Basophils Absolute: 0 10*3/uL (ref 0.0–0.1)
Basophils Relative: 0.8 % (ref 0.0–3.0)
Eosinophils Absolute: 0.1 10*3/uL (ref 0.0–0.7)
Eosinophils Relative: 2.8 % (ref 0.0–5.0)
HCT: 39 % (ref 36.0–46.0)
Hemoglobin: 13.1 g/dL (ref 12.0–15.0)
Lymphocytes Relative: 26.9 % (ref 12.0–46.0)
Lymphs Abs: 1.3 10*3/uL (ref 0.7–4.0)
MCHC: 33.5 g/dL (ref 30.0–36.0)
MCV: 93.7 fl (ref 78.0–100.0)
Monocytes Absolute: 0.4 10*3/uL (ref 0.1–1.0)
Monocytes Relative: 7.9 % (ref 3.0–12.0)
Neutro Abs: 3 10*3/uL (ref 1.4–7.7)
Neutrophils Relative %: 61.6 % (ref 43.0–77.0)
Platelets: 193 10*3/uL (ref 150.0–400.0)
RBC: 4.16 Mil/uL (ref 3.87–5.11)
RDW: 13.1 % (ref 11.5–15.5)
WBC: 4.9 10*3/uL (ref 4.0–10.5)

## 2017-04-06 LAB — COMPREHENSIVE METABOLIC PANEL WITH GFR
ALT: 19 U/L (ref 0–35)
AST: 19 U/L (ref 0–37)
Albumin: 4.2 g/dL (ref 3.5–5.2)
Alkaline Phosphatase: 46 U/L (ref 39–117)
BUN: 31 mg/dL — ABNORMAL HIGH (ref 6–23)
CO2: 29 meq/L (ref 19–32)
Calcium: 9.8 mg/dL (ref 8.4–10.5)
Chloride: 106 meq/L (ref 96–112)
Creatinine, Ser: 1.1 mg/dL (ref 0.40–1.20)
GFR: 50.57 mL/min — ABNORMAL LOW
Glucose, Bld: 92 mg/dL (ref 70–99)
Potassium: 4.2 meq/L (ref 3.5–5.1)
Sodium: 141 meq/L (ref 135–145)
Total Bilirubin: 0.5 mg/dL (ref 0.2–1.2)
Total Protein: 6.9 g/dL (ref 6.0–8.3)

## 2017-04-06 LAB — C-REACTIVE PROTEIN: CRP: 0.1 mg/dL — ABNORMAL LOW (ref 0.5–20.0)

## 2017-04-06 NOTE — Patient Instructions (Signed)
Your physician has requested that you go to the basement for the following lab work before leaving today: CBC, CMP, CRP  You have been scheduled for a CT scan of the abdomen and pelvis at [facility] ([address]---this is in the same building as [facility]).   You are scheduled on 05-02-17 at 1:30 pm. You should arrive 15 minutes prior to your appointment time for registration. Please follow the written instructions below on the day of your exam:  WARNING: IF YOU ARE ALLERGIC TO IODINE/X-RAY DYE, PLEASE NOTIFY RADIOLOGY IMMEDIATELY AT [phone]! YOU WILL BE GIVEN A 13 HOUR PREMEDICATION PREP.  1) Do not eat or drink anything after 9:30 am (4 hours prior to your test) 2) You have been given 2 bottles of oral contrast to drink. The solution may taste               better if refrigerated, but do NOT add ice or any other liquid to this solution. Shake             well before drinking.    Drink 1 bottle of contrast @ 11:30 am (2 hours prior to your exam)  Drink 1 bottle of contrast @ 12:30 pm (1 hour prior to your exam)  You may take any medications as prescribed with a small amount of water except for the following: Metformin, Glucophage, Glucovance, Avandamet, Riomet, Fortamet, Actoplus Met, Janumet, Glumetza or Metaglip. The above medications must be held the day of the exam AND 48 hours after the exam.  The purpose of you drinking the oral contrast is to aid in the visualization of your intestinal tract. The contrast solution may cause some diarrhea. Before your exam is started, you will be given a small amount of fluid to drink. Depending on your individual set of symptoms, you may also receive an intravenous injection of x-ray contrast/dye. Plan on being at [facility] for 30 minutes or longer, depending on the type of exam you are having performed.  This test typically takes 30-45 minutes to complete.  If you have any questions regarding your exam or if you  need to reschedule, you may call the CT department at [phone] between the hours of 8:00 am and 5:00 pm, Monday-Friday.  ________________________________________________________________________

## 2017-04-06 NOTE — Progress Notes (Signed)
Chief Complaint: Nausea, weight loss, bloating and generalized abdominal pain  HPI:    Sandra Yang is an 84 year old female with a history of Crohn's colitis, GERD, IBS and history of adenomatous colon polyps, who presents to clinic today with a complaint of nausea and weight loss.    Patient's last colonoscopy was 03/2015 with a finding of 4 sessile polyps in the descending colon and prior surgical anastomosis in the ascending colon.  Patient did have an EGD 09/2003 which was normal.    Patient follows with Dr. Fuller Plan and was last seen 06/17/16 at that time described frequent problems with post meal fecal urgency and frequent bloating as well as discomfort.  She used hyoscyamine infrequently but it was helpful for her symptoms.  She had actually had some improvement of her urgency and loose stools following meals over the past few months.  She described reflux symptoms were under good control on her current regimen.  She was continued on Lialda 2.4 g p.o. daily.  She was continued on Hyoscyamine 1-2 sublingual tabs when necessary for abdominal pain, bloating and urgency.  She was continued on Omeprazole 20 mg once daily.    Today, the patient explains that about a month ago when she called our office she was having a lot of nausea and losing weight and had a decrease in her appetite, over the past couple of weeks all of these symptoms have gone away.  Currently, the patient tells me that she is worried regarding an abdominal pain which seems to "come and go", this can occur in almost any part of her abdomen and usually only lasts for a few seconds to a couple of minutes at a time.  She describes this anywhere from a "cramp to a pinch".  She describes this can be anywhere from a 6/10-8/10.  There is no pattern to this pain. It has been ocurring like this for years and at one point "Dr. Fuller Plan offered me imaging". She is also occasionally bloated.  Patient does tell me that hyoscyamine tends to help the  symptoms which she uses as needed.  Overall the patient is just worried in regards to her ongoing bloating and abdominal pain and would like to make sure "nothing's going on".    Patient denies any increased stress or anxiety over the holidays, fever, chills, continued weight loss, change in bowel habits, anorexia, nausea, vomiting, heartburn, reflux or symptoms that awaken her at night.  [facility] Past Medical History:  Diagnosis Date  . Allergy   . Crohn's colitis ([place]) 10/2008  . GERD (gastroesophageal reflux disease)   . Hypertension   . Tubulovillous adenoma polyp of colon 03/1999    Past Surgical History:  Procedure Laterality Date  . APPENDECTOMY    . CHOLECYSTECTOMY  2001  . FOOT SURGERY    . HAND SURGERY    . HEMICOLECTOMY  2001  . VAGINAL HYSTERECTOMY      Current Outpatient Medications  Medication Sig Dispense Refill  . amLODipine (NORVASC) 5 MG tablet Take 1 tablet (5 mg total) by mouth daily. 30 tablet 0  . aspirin 81 MG tablet Take 81 mg by mouth daily.      . Cholecalciferol (VITAMIN D) 2000 UNITS CAPS Take 2,000 Units by mouth daily.     . fenofibrate 160 MG tablet Take 160 mg by mouth daily.      . hyoscyamine (LEVSIN, ANASPAZ) 0.125 MG tablet PLACE 1-2 TABLETS UNDER TONGUE EVERY 4 HOURS AS NEEDED FOR  ABDOMINAL PAIN 360 tablet 3  . KRILL OIL PO Take 1 tablet by mouth daily.    . mesalamine (LIALDA) 1.2 g EC tablet TAKE 2 TABLETS BY MOUTH EVERY DAY WITH BREAKFAST 180 tablet 3  . metoprolol succinate (TOPROL-XL) 25 MG 24 hr tablet Take 25 mg by mouth daily.    Marland Kitchen omeprazole (PRILOSEC) 20 MG capsule Take 1 capsule (20 mg total) by mouth daily. 90 capsule 3  . triamterene-hydrochlorothiazide (MAXZIDE-25) 37.5-25 MG per tablet Take 1 tablet by mouth daily.      No current facility-administered medications for this visit.     Allergies as of 04/06/2017 - Review Complete 04/06/2017  Allergen Reaction Noted  . Erythromycin Itching   . Tetanus toxoids Swelling  11/25/2010  . Tape Rash 02/17/2015    Family History  Problem Relation Age of Onset  . Prostate cancer Father   . Bone cancer Father   . Breast cancer Sister   . Colon cancer Neg Hx   . Stomach cancer Neg Hx     Social History   Socioeconomic History  . Marital status: Widowed    Spouse name: Not on file  . Number of children: 1  . Years of education: Not on file  . Highest education level: Not on file  Social Needs  . Financial resource strain: Not on file  . Food insecurity - worry: Not on file  . Food insecurity - inability: Not on file  . Transportation needs - medical: Not on file  . Transportation needs - non-medical: Not on file  Occupational History  . Occupation: Retired  Tobacco Use  . Smoking status: Former Smoker    Last attempt to quit: 04/05/1980    Years since quitting: 37.0  . Smokeless tobacco: Never Used  Substance and Sexual Activity  . Alcohol use: Yes    Alcohol/week: 1.2 oz    Types: 2 Glasses of wine per week    Comment: socially  . Drug use: No  . Sexual activity: Not on file  Other Topics Concern  . Not on file  Social History Narrative  . Not on file    Review of Systems:    Constitutional: No weight loss, fever or chills Cardiovascular: No chest pain Respiratory: No SOB  Gastrointestinal: See HPI and otherwise negative   Physical Exam:  Vital signs: BP (!) 118/54   Pulse 60   Ht 4' 11"  (1.499 m)   Wt 129 lb 9.6 oz (58.8 kg)   BMI 26.18 kg/m    Constitutional:   Pleasant overweight Caucasian female appears to be in NAD, Well developed, Well nourished, alert and cooperative Respiratory: Respirations even and unlabored. Lungs clear to auscultation bilaterally.   No wheezes, crackles, or rhonchi.  Cardiovascular: Normal S1, S2. No MRG. Regular rate and rhythm. No peripheral edema, cyanosis or pallor.  Gastrointestinal:  Soft, nondistended, mild generalized ttp, No rebound or guarding. Normal bowel sounds. No appreciable masses or  hepatomegaly. Psychiatric:  Demonstrates good judgement and reason without abnormal affect or behaviors.  NO recent labs or imaging.  Assessment: 1. Nausea: Recently patient experienced increased nausea and loss of appetite over 2-week period, this is better over the past 2 weeks; consider relation to holiday diet and increase greasy/fatty fatty foods versus functional dyspepsia versus viral cause 2. Crohn's colitis: At last check clinically quiescent, patient maintained on Lialda 2.4 g p.o. daily 3. GERD: Controlled on Omeprazole 20 mg daily 4. Bloating: Continues, chronic for the patient, somewhat helped by  Hyoscyamine; consider relation to IBS most likely  Plan: 1.  Ordered labs to include a CBC, CMP and CRP 2.  Order CT the abdomen pelvis with contrast 3.  Patient to continue her Lialda 2.4 g daily 4.  Patient to continue Omeprazole 20 mg once daily 5.  Patient to follow in clinic with Dr. Fuller Plan in 6-8 weeks or sooner as directed by imaging and labs  Ellouise Newer, PA-C [facility] 04/06/2017, 2:49 PM  Cc: Leanna Battles, MD

## 2017-04-06 NOTE — Progress Notes (Signed)
Reviewed and agree with initial management plan.  Cierra Rothgeb T. Jamelah Sitzer, MD FACG 

## 2017-04-07 ENCOUNTER — Encounter (HOSPITAL_BASED_OUTPATIENT_CLINIC_OR_DEPARTMENT_OTHER): Payer: Self-pay | Admitting: *Deleted

## 2017-04-11 ENCOUNTER — Ambulatory Visit
Admission: RE | Admit: 2017-04-11 | Discharge: 2017-04-11 | Disposition: A | Discharge status: Home / Self Care | Payer: Medicare HMO | Source: Ambulatory Visit | Attending: General Surgery | Admitting: General Surgery

## 2017-04-11 DIAGNOSIS — R928 Other abnormal and inconclusive findings on diagnostic imaging of breast: Secondary | ICD-10-CM | POA: Diagnosis not present

## 2017-04-11 DIAGNOSIS — N6489 Other specified disorders of breast: Secondary | ICD-10-CM

## 2017-04-13 ENCOUNTER — Other Ambulatory Visit: Payer: Self-pay

## 2017-04-13 ENCOUNTER — Encounter (HOSPITAL_BASED_OUTPATIENT_CLINIC_OR_DEPARTMENT_OTHER): Payer: Self-pay | Admitting: Anesthesiology

## 2017-04-13 ENCOUNTER — Encounter (HOSPITAL_BASED_OUTPATIENT_CLINIC_OR_DEPARTMENT_OTHER)
Admission: RE | Disposition: A | Discharge status: Home / Self Care | Payer: Self-pay | Source: Ambulatory Visit | Attending: General Surgery

## 2017-04-13 ENCOUNTER — Ambulatory Visit (HOSPITAL_BASED_OUTPATIENT_CLINIC_OR_DEPARTMENT_OTHER)
Admission: RE | Admit: 2017-04-13 | Discharge: 2017-04-13 | Disposition: A | Discharge status: Home / Self Care | Payer: Medicare HMO | Source: Ambulatory Visit | Attending: General Surgery | Admitting: General Surgery

## 2017-04-13 ENCOUNTER — Ambulatory Visit
Admission: RE | Admit: 2017-04-13 | Discharge: 2017-04-13 | Disposition: A | Discharge status: Home / Self Care | Payer: Medicare HMO | Source: Ambulatory Visit | Attending: General Surgery | Admitting: General Surgery

## 2017-04-13 ENCOUNTER — Ambulatory Visit (HOSPITAL_BASED_OUTPATIENT_CLINIC_OR_DEPARTMENT_OTHER): Payer: Medicare HMO | Admitting: Anesthesiology

## 2017-04-13 DIAGNOSIS — N6489 Other specified disorders of breast: Secondary | ICD-10-CM | POA: Diagnosis not present

## 2017-04-13 DIAGNOSIS — I10 Essential (primary) hypertension: Secondary | ICD-10-CM | POA: Insufficient documentation

## 2017-04-13 DIAGNOSIS — R920 Mammographic microcalcification found on diagnostic imaging of breast: Secondary | ICD-10-CM | POA: Diagnosis present

## 2017-04-13 DIAGNOSIS — N6081 Other benign mammary dysplasias of right breast: Secondary | ICD-10-CM | POA: Diagnosis not present

## 2017-04-13 DIAGNOSIS — Z79899 Other long term (current) drug therapy: Secondary | ICD-10-CM | POA: Diagnosis not present

## 2017-04-13 DIAGNOSIS — K5090 Crohn's disease, unspecified, without complications: Secondary | ICD-10-CM | POA: Insufficient documentation

## 2017-04-13 DIAGNOSIS — E785 Hyperlipidemia, unspecified: Secondary | ICD-10-CM | POA: Diagnosis not present

## 2017-04-13 DIAGNOSIS — K219 Gastro-esophageal reflux disease without esophagitis: Secondary | ICD-10-CM | POA: Diagnosis not present

## 2017-04-13 DIAGNOSIS — R928 Other abnormal and inconclusive findings on diagnostic imaging of breast: Secondary | ICD-10-CM | POA: Diagnosis not present

## 2017-04-13 DIAGNOSIS — Z7982 Long term (current) use of aspirin: Secondary | ICD-10-CM | POA: Insufficient documentation

## 2017-04-13 DIAGNOSIS — N6011 Diffuse cystic mastopathy of right breast: Secondary | ICD-10-CM | POA: Diagnosis not present

## 2017-04-13 DIAGNOSIS — Z87891 Personal history of nicotine dependence: Secondary | ICD-10-CM | POA: Diagnosis not present

## 2017-04-13 DIAGNOSIS — R921 Mammographic calcification found on diagnostic imaging of breast: Secondary | ICD-10-CM | POA: Diagnosis not present

## 2017-04-13 HISTORY — PX: RADIOACTIVE SEED GUIDED EXCISIONAL BREAST BIOPSY: SHX6490

## 2017-04-13 SURGERY — RADIOACTIVE SEED GUIDED BREAST BIOPSY
Anesthesia: General | Site: Breast | Laterality: Right

## 2017-04-13 MED ORDER — PROPOFOL 10 MG/ML IV BOLUS
INTRAVENOUS | Status: DC | PRN
  Administered 2017-04-13: 100 mg via INTRAVENOUS

## 2017-04-13 MED ORDER — FENTANYL CITRATE (PF) 100 MCG/2ML IJ SOLN
50.0000 ug | INTRAMUSCULAR | Status: DC | PRN
  Administered 2017-04-13: 50 ug via INTRAVENOUS

## 2017-04-13 MED ORDER — ONDANSETRON HCL 4 MG/2ML IJ SOLN
INTRAMUSCULAR | Status: DC | PRN
  Administered 2017-04-13: 4 mg via INTRAVENOUS

## 2017-04-13 MED ORDER — CEFAZOLIN SODIUM-DEXTROSE 2-4 GM/100ML-% IV SOLN
2.0000 g | INTRAVENOUS | Status: AC
  Administered 2017-04-13: 2 g via INTRAVENOUS

## 2017-04-13 MED ORDER — SCOPOLAMINE 1 MG/3DAYS TD PT72: 1.0000 | MEDICATED_PATCH | Freq: Once | TRANSDERMAL | Status: DC | PRN

## 2017-04-13 MED ORDER — EPHEDRINE SULFATE 50 MG/ML IJ SOLN
INTRAMUSCULAR | Status: DC | PRN
  Administered 2017-04-13: 10 mg via INTRAVENOUS

## 2017-04-13 MED ORDER — DEXAMETHASONE SODIUM PHOSPHATE 10 MG/ML IJ SOLN
INTRAMUSCULAR | Status: AC
  Filled 2017-04-13: qty 1

## 2017-04-13 MED ORDER — FENTANYL CITRATE (PF) 100 MCG/2ML IJ SOLN
INTRAMUSCULAR | Status: AC
  Filled 2017-04-13: qty 2

## 2017-04-13 MED ORDER — FENTANYL CITRATE (PF) 100 MCG/2ML IJ SOLN: 25.0000 ug | INTRAMUSCULAR | Status: DC | PRN

## 2017-04-13 MED ORDER — PROPOFOL 10 MG/ML IV BOLUS
INTRAVENOUS | Status: AC
  Filled 2017-04-13: qty 20

## 2017-04-13 MED ORDER — MIDAZOLAM HCL 2 MG/2ML IJ SOLN: 1.0000 mg | INTRAMUSCULAR | Status: DC | PRN

## 2017-04-13 MED ORDER — GABAPENTIN 300 MG PO CAPS
300.0000 mg | ORAL_CAPSULE | ORAL | Status: AC
  Administered 2017-04-13: 300 mg via ORAL

## 2017-04-13 MED ORDER — LIDOCAINE 2% (20 MG/ML) 5 ML SYRINGE
INTRAMUSCULAR | Status: DC | PRN
  Administered 2017-04-13: 60 mg via INTRAVENOUS

## 2017-04-13 MED ORDER — LACTATED RINGERS IV SOLN
INTRAVENOUS | Status: DC
  Administered 2017-04-13: 12:00:00 via INTRAVENOUS

## 2017-04-13 MED ORDER — GABAPENTIN 300 MG PO CAPS
ORAL_CAPSULE | ORAL | Status: AC
  Filled 2017-04-13: qty 1

## 2017-04-13 MED ORDER — LIDOCAINE 2% (20 MG/ML) 5 ML SYRINGE
INTRAMUSCULAR | Status: AC
  Filled 2017-04-13: qty 5

## 2017-04-13 MED ORDER — BUPIVACAINE HCL (PF) 0.25 % IJ SOLN
INTRAMUSCULAR | Status: DC | PRN
  Administered 2017-04-13: 10 mL

## 2017-04-13 MED ORDER — CEFAZOLIN SODIUM-DEXTROSE 2-4 GM/100ML-% IV SOLN
INTRAVENOUS | Status: AC
  Filled 2017-04-13: qty 100

## 2017-04-13 MED ORDER — ONDANSETRON HCL 4 MG/2ML IJ SOLN
INTRAMUSCULAR | Status: AC
  Filled 2017-04-13: qty 2

## 2017-04-13 MED ORDER — PROMETHAZINE HCL 25 MG/ML IJ SOLN: 6.2500 mg | INTRAMUSCULAR | Status: DC | PRN

## 2017-04-13 MED ORDER — ACETAMINOPHEN 500 MG PO TABS
ORAL_TABLET | ORAL | Status: AC
  Filled 2017-04-13: qty 2

## 2017-04-13 MED ORDER — ACETAMINOPHEN 500 MG PO TABS
1000.0000 mg | ORAL_TABLET | ORAL | Status: AC
  Administered 2017-04-13: 1000 mg via ORAL

## 2017-04-13 MED ORDER — DEXAMETHASONE SODIUM PHOSPHATE 4 MG/ML IJ SOLN
INTRAMUSCULAR | Status: DC | PRN
  Administered 2017-04-13: 10 mg via INTRAVENOUS

## 2017-04-13 SURGICAL SUPPLY — 56 items
APPLIER CLIP 9.375 MED OPEN (MISCELLANEOUS)
BINDER BREAST LRG (GAUZE/BANDAGES/DRESSINGS) IMPLANT
BINDER BREAST MEDIUM (GAUZE/BANDAGES/DRESSINGS) ×3 IMPLANT
BINDER BREAST XLRG (GAUZE/BANDAGES/DRESSINGS) IMPLANT
BINDER BREAST XXLRG (GAUZE/BANDAGES/DRESSINGS) IMPLANT
BLADE SURG 15 STRL LF DISP TIS (BLADE) ×1 IMPLANT
BLADE SURG 15 STRL SS (BLADE) ×2
CANISTER SUC SOCK COL 7IN (MISCELLANEOUS) IMPLANT
CANISTER SUCT 1200ML W/VALVE (MISCELLANEOUS) IMPLANT
CHLORAPREP W/TINT 26ML (MISCELLANEOUS) ×3 IMPLANT
CLIP APPLIE 9.375 MED OPEN (MISCELLANEOUS) IMPLANT
CLIP VESOCCLUDE SM WIDE 6/CT (CLIP) IMPLANT
CLOSURE WOUND 1/2 X4 (GAUZE/BANDAGES/DRESSINGS) ×1
COVER BACK TABLE 60X90IN (DRAPES) ×3 IMPLANT
COVER MAYO STAND STRL (DRAPES) ×3 IMPLANT
COVER PROBE W GEL 5X96 (DRAPES) ×3 IMPLANT
DECANTER SPIKE VIAL GLASS SM (MISCELLANEOUS) IMPLANT
DERMABOND ADVANCED (GAUZE/BANDAGES/DRESSINGS) ×2
DERMABOND ADVANCED .7 DNX12 (GAUZE/BANDAGES/DRESSINGS) ×1 IMPLANT
DEVICE DUBIN W/COMP PLATE 8390 (MISCELLANEOUS) ×3 IMPLANT
DRAPE LAPAROSCOPIC ABDOMINAL (DRAPES) ×3 IMPLANT
DRAPE UTILITY XL STRL (DRAPES) ×3 IMPLANT
DRSG TEGADERM 4X4.75 (GAUZE/BANDAGES/DRESSINGS) IMPLANT
ELECT COATED BLADE 2.86 ST (ELECTRODE) ×3 IMPLANT
ELECT REM PT RETURN 9FT ADLT (ELECTROSURGICAL) ×3
ELECTRODE REM PT RTRN 9FT ADLT (ELECTROSURGICAL) ×1 IMPLANT
GAUZE SPONGE 4X4 12PLY STRL LF (GAUZE/BANDAGES/DRESSINGS) IMPLANT
GLOVE BIO SURGEON STRL SZ7 (GLOVE) ×6 IMPLANT
GLOVE BIOGEL PI IND STRL 7.5 (GLOVE) ×1 IMPLANT
GLOVE BIOGEL PI INDICATOR 7.5 (GLOVE) ×2
GOWN STRL REUS W/ TWL LRG LVL3 (GOWN DISPOSABLE) ×2 IMPLANT
GOWN STRL REUS W/TWL LRG LVL3 (GOWN DISPOSABLE) ×4
HEMOSTAT ARISTA ABSORB 3G PWDR (MISCELLANEOUS) IMPLANT
ILLUMINATOR WAVEGUIDE N/F (MISCELLANEOUS) IMPLANT
KIT MARKER MARGIN INK (KITS) ×3 IMPLANT
LIGHT WAVEGUIDE WIDE FLAT (MISCELLANEOUS) IMPLANT
NEEDLE HYPO 25X1 1.5 SAFETY (NEEDLE) ×3 IMPLANT
NS IRRIG 1000ML POUR BTL (IV SOLUTION) IMPLANT
PACK BASIN DAY SURGERY FS (CUSTOM PROCEDURE TRAY) ×3 IMPLANT
PENCIL BUTTON HOLSTER BLD 10FT (ELECTRODE) ×3 IMPLANT
SLEEVE SCD COMPRESS KNEE MED (MISCELLANEOUS) ×3 IMPLANT
SPONGE LAP 4X18 X RAY DECT (DISPOSABLE) ×3 IMPLANT
STRIP CLOSURE SKIN 1/2X4 (GAUZE/BANDAGES/DRESSINGS) ×2 IMPLANT
SUT MNCRL AB 4-0 PS2 18 (SUTURE) ×3 IMPLANT
SUT MON AB 5-0 PS2 18 (SUTURE) IMPLANT
SUT SILK 2 0 SH (SUTURE) IMPLANT
SUT VIC AB 2-0 SH 27 (SUTURE) ×2
SUT VIC AB 2-0 SH 27XBRD (SUTURE) ×1 IMPLANT
SUT VIC AB 3-0 SH 27 (SUTURE) ×2
SUT VIC AB 3-0 SH 27X BRD (SUTURE) ×1 IMPLANT
SYR CONTROL 10ML LL (SYRINGE) ×3 IMPLANT
TOWEL OR 17X24 6PK STRL BLUE (TOWEL DISPOSABLE) ×3 IMPLANT
TOWEL OR NON WOVEN STRL DISP B (DISPOSABLE) ×3 IMPLANT
TUBE CONNECTING 20'X1/4 (TUBING)
TUBE CONNECTING 20X1/4 (TUBING) IMPLANT
YANKAUER SUCT BULB TIP NO VENT (SUCTIONS) IMPLANT

## 2017-04-13 NOTE — Transfer of Care (Signed)
Immediate Anesthesia Transfer of Care Note  Patient: Sandra Yang  Procedure(s) Performed: RIGHT RADIOACTIVE SEED GUIDED EXCISIONAL BREAST BIOPSY ERAS PATHWAY (Right Breast)  Patient Location: PACU  Anesthesia Type:General  Level of Consciousness: sedated  Airway & Oxygen Therapy: Patient Spontanous Breathing and Patient connected to face mask oxygen  Post-op Assessment: Report given to RN and Post -op Vital signs reviewed and stable  Post vital signs: Reviewed and stable  Last Vitals:  Vitals:   04/13/17 1139  BP: (!) 154/47  Pulse: 70  Resp: 20  Temp: 36.7 C  SpO2: 99%    Last Pain:  Vitals:   04/13/17 1139  TempSrc: Oral         Complications: No apparent anesthesia complications

## 2017-04-13 NOTE — Anesthesia Preprocedure Evaluation (Addendum)
Anesthesia Evaluation  Patient identified by MRN, date of birth, ID band Patient awake    Reviewed: Allergy & Precautions, NPO status , Patient's Chart, lab work & pertinent test results, reviewed documented beta blocker date and time   Airway Mallampati: III  TM Distance: >3 FB Neck ROM: Full    Dental  (+) Teeth Intact, Dental Advisory Given   Pulmonary former smoker,    Pulmonary exam normal breath sounds clear to auscultation       Cardiovascular hypertension, Pt. on medications and Pt. on home beta blockers Normal cardiovascular exam Rhythm:Regular Rate:Normal     Neuro/Psych negative neurological ROS  negative psych ROS   GI/Hepatic Neg liver ROS, GERD  Medicated and Controlled,Crohn's colitis   Endo/Other  negative endocrine ROS  Renal/GU negative Renal ROS     Musculoskeletal negative musculoskeletal ROS (+)   Abdominal   Peds  Hematology negative hematology ROS (+)   Anesthesia Other Findings Day of surgery medications reviewed with the patient.  right breast calcifications  Reproductive/Obstetrics                           Anesthesia Physical Anesthesia Plan  ASA: II  Anesthesia Plan: General   Post-op Pain Management:    Induction: Intravenous  PONV Risk Score and Plan: 3 and Dexamethasone, Ondansetron and Treatment may vary due to age or medical condition  Airway Management Planned: LMA  Additional Equipment:   Intra-op Plan:   Post-operative Plan: Extubation in OR  Informed Consent: I have reviewed the patients History and Physical, chart, labs and discussed the procedure including the risks, benefits and alternatives for the proposed anesthesia with the patient or authorized representative who has indicated his/her understanding and acceptance.   Dental advisory given  Plan Discussed with: CRNA  Anesthesia Plan Comments: (Risks/benefits of general anesthesia  discussed with patient including risk of damage to teeth, lips, gum, and tongue, nausea/vomiting, allergic reactions to medications, and the possibility of heart attack, stroke and death.  All patient questions answered.  Patient wishes to proceed.)      Anesthesia Quick Evaluation

## 2017-04-13 NOTE — Discharge Instructions (Signed)
Post Anesthesia Home Care Instructions  Activity: Get plenty of rest for the remainder of the day. A responsible individual must stay with you for 24 hours following the procedure.  For the next 24 hours, DO NOT: -Drive a car -Paediatric nurse -Drink alcoholic beverages -Take any medication unless instructed by your physician -Make any legal decisions or sign important papers.  Meals: Start with liquid foods such as gelatin or soup. Progress to regular foods as tolerated. Avoid greasy, spicy, heavy foods. If nausea and/or vomiting occur, drink only clear liquids until the nausea and/or vomiting subsides. Call your physician if vomiting continues.  Special Instructions/Symptoms: Your throat may feel dry or sore from the anesthesia or the breathing tube placed in your throat during surgery. If this causes discomfort, gargle with warm salt water. The discomfort should disappear within 24 hours.  If you had a scopolamine patch placed behind your ear for the management of post- operative nausea and/or vomiting:  1. The medication in the patch is effective for 72 hours, after which it should be removed.  Wrap patch in a tissue and discard in the trash. Wash hands thoroughly with soap and water. 2. You may remove the patch earlier than 72 hours if you experience unpleasant side effects which may include dry mouth, dizziness or visual disturbances. 3. Avoid touching the patch. Wash your hands with soap and water after contact with the patch.   [place] Office Phone Number [phone]  POST OP INSTRUCTIONS  Always review your discharge instruction sheet given to you by the facility where your surgery was performed.  IF YOU HAVE DISABILITY OR FAMILY LEAVE FORMS, YOU MUST BRING THEM TO THE OFFICE FOR PROCESSING.  DO NOT GIVE THEM TO YOUR DOCTOR.  1. A prescription for pain medication may be given to you upon discharge.  Take your pain medication as prescribed, if needed.   If narcotic pain medicine is not needed, then you may take acetaminophen (Tylenol), naprosyn (Alleve) or ibuprofen (Advil) as needed. 2. Take your usually prescribed medications unless otherwise directed 3. If you need a refill on your pain medication, please contact your pharmacy.  They will contact our office to request authorization.  Prescriptions will not be filled after 5pm or on week-ends. 4. You should eat very light the first 24 hours after surgery, such as soup, crackers, pudding, etc.  Resume your normal diet the day after surgery. 5. Most patients will experience some swelling and bruising in the breast.  Ice packs and a good support bra will help.  Wear the breast binder provided or a sports bra for 72 hours day and night.  After that wear a sports bra during the day until you return to the office. Swelling and bruising can take several days to resolve.  6. It is common to experience some constipation if taking pain medication after surgery.  Increasing fluid intake and taking a stool softener will usually help or prevent this problem from occurring.  A mild laxative (Milk of Magnesia or Miralax) should be taken according to package directions if there are no bowel movements after 48 hours. 7. Unless discharge instructions indicate otherwise, you may remove your bandages 48 hours after surgery and you may shower at that time.  You may have steri-strips (small skin tapes) in place directly over the incision.  These strips should be left on the skin for 7-10 days and will come off on their own.  If your surgeon used skin glue on the incision,  you may shower in 24 hours.  The glue will flake off over the next 2-3 weeks.  Any sutures or staples will be removed at the office during your follow-up visit. 8. ACTIVITIES:  You may resume regular daily activities (gradually increasing) beginning the next day.  Wearing a good support bra or sports bra minimizes pain and swelling.  You may have sexual  intercourse when it is comfortable. a. You may drive when you no longer are taking prescription pain medication, you can comfortably wear a seatbelt, and you can safely maneuver your car and apply brakes. b. RETURN TO WORK:  ______________________________________________________________________________________ 9. You should see your doctor in the office for a follow-up appointment approximately two weeks after your surgery.  Your doctors nurse will typically make your follow-up appointment when she calls you with your pathology report.  Expect your pathology report 3-4 business days after your surgery.  You may call to check if you do not hear from [place] after three days. 10. OTHER INSTRUCTIONS: _______________________________________________________________________________________________ _____________________________________________________________________________________________________________________________________ _____________________________________________________________________________________________________________________________________ _____________________________________________________________________________________________________________________________________  WHEN TO CALL DR WAKEFIELD: 1. Fever over 101.0 2. Nausea and/or vomiting. 3. Extreme swelling or bruising. 4. Continued bleeding from incision. 5. Increased pain, redness, or drainage from the incision.  The clinic staff is available to answer your questions during regular business hours.  Please dont hesitate to call and ask to speak to one of the nurses for clinical concerns.  If you have a medical emergency, go to the nearest emergency room or call 911.  A surgeon from [facility] is always on call at the hospital.  For further questions, please visit [email or web address]  NO TYLENOL UNTIL AFTER 6PM!!

## 2017-04-13 NOTE — Interval H&P Note (Signed)
History and Physical Interval Note:  04/13/2017 12:44 PM  Sandra Yang  has presented today for surgery, with the diagnosis of right breast calcifications  The various methods of treatment have been discussed with the patient and family. After consideration of risks, benefits and other options for treatment, the patient has consented to  Procedure(s) with comments: RIGHT RADIOACTIVE SEED GUIDED EXCISIONAL BREAST BIOPSY ERAS PATHWAY (Right) - LMA as a surgical intervention .  The patient's history has been reviewed, patient examined, no change in status, stable for surgery.  I have reviewed the patient's chart and labs.  Questions were answered to the patient's satisfaction.     Rolm Bookbinder

## 2017-04-13 NOTE — H&P (Signed)
84 yof referred by Dr Jeanmarie Plant for new right breast calcifications. she has no personal history of breast disease. she has fh of breast cancer in her sister in her 53s. she did not have mass or dc. she was noted on screening mm to have b density breast tissue. in the upper outer right breast she has a 10 mm new area of calcifications. this underwent biopsy and is a csl.  Past Surgical History  Appendectomy  Breast Biopsy  Right. Cataract Surgery  Bilateral. Colon Polyp Removal - Colonoscopy  Colon Removal - Partial  Gallbladder Surgery - Open  Hysterectomy (not due to cancer) - Complete  Tonsillectomy   Diagnostic Studies History  Mammogram  within last year Pap Smear  >5 years ago  Allergies  ERYTHROMYCIN  Itching. Tetanus Toxoid Adsorbed *TOXOIDS*  swelling swollen fever  Medication History  Fenofibrate (160MG Tablet, Oral) Active. Metoprolol Succinate ER (25MG Tablet ER 24HR, Oral) Active. Omeprazole (20MG Capsule DR, Oral) Active. Triamterene-HCTZ (37.5-25MG Tablet, Oral) Active. Krill Oil (500MG Capsule, Oral) Active. Hyoscyamine Sulfate (0.125MG Tablet, Oral) Active. Vitamin D3 (2000UNIT Capsule, Oral) Active. Aspirin (81MG Tablet, Oral) Active. Lialda (1.2GM Tablet DR, Oral) Active. Medications Reconciled  Social History Alcohol use  Moderate alcohol use. No caffeine use  No drug use  Tobacco use  Former smoker.  Family History  Arthritis  Daughter, Father, Mother, Sister. Breast Cancer  Sister. Cerebrovascular Accident  Mother. Hypertension  Mother. Melanoma  Daughter. Migraine Headache  Daughter. Prostate Cancer  Father.  Pregnancy / Birth History  Age at menarche  45 years. Age of menopause  <47 Gravida  1 Maternal age  42-30 Para  1  Other Problems  Crohn's Disease  Gastroesophageal Reflux Disease  High blood pressure  Oophorectomy  Bilateral. Ulcerative Colitis    Review of Systems  General Not  Present- Appetite Loss, Chills, Fatigue, Fever, Night Sweats, Weight Gain and Weight Loss. Skin Not Present- Change in Wart/Mole, Dryness, Hives, Jaundice, New Lesions, Non-Healing Wounds, Rash and Ulcer. HEENT Present- Hearing Loss, Ringing in the Ears and Seasonal Allergies. Not Present- Earache, Hoarseness, Nose Bleed, Oral Ulcers, Sinus Pain, Sore Throat, Visual Disturbances, Wears glasses/contact lenses and Yellow Eyes. Cardiovascular Present- Leg Cramps. Not Present- Chest Pain, Difficulty Breathing Lying Down, Palpitations, Rapid Heart Rate, Shortness of Breath and Swelling of Extremities. Gastrointestinal Present- Abdominal Pain and Bloating. Not Present- Bloody Stool, Change in Bowel Habits, Chronic diarrhea, Constipation, Difficulty Swallowing, Excessive gas, Gets full quickly at meals, Hemorrhoids, Indigestion, Nausea, Rectal Pain and Vomiting. Female Genitourinary Not Present- Frequency, Nocturia, Painful Urination, Pelvic Pain and Urgency. Musculoskeletal Not Present- Back Pain, Joint Pain, Joint Stiffness, Muscle Pain, Muscle Weakness and Swelling of Extremities. Neurological Not Present- Decreased Memory, Fainting, Headaches, Numbness, Seizures, Tingling, Tremor, Trouble walking and Weakness. Psychiatric Not Present- Anxiety, Bipolar, Change in Sleep Pattern, Depression, Fearful and Frequent crying. Hematology Present- Easy Bruising. Not Present- Blood Thinners, Excessive bleeding, Gland problems, HIV and Persistent Infections.  Vitals  Weight: 131 lb Height: 59in Body Surface Area: 1.54 m Body Mass Index: 26.46 kg/m  Temp.: 97.24F  Pulse: 73 (Regular)  BP: 140/70 (Sitting, Left Arm, Standard) Physical Exam  General Mental Status-Alert. Head and Neck Trachea-midline. Thyroid Gland Characteristics - normal size and consistency. Eye Sclera/Conjunctiva - Bilateral-No scleral icterus. Chest and Lung Exam Chest and lung exam reveals -quiet, even and easy  respiratory effort with no use of accessory muscles and on auscultation, normal breath sounds, no adventitious sounds and normal vocal resonance. Breast Nipples-No Discharge. Breast Lump-No  Palpable Breast Mass. Cardiovascular Cardiovascular examination reveals -normal heart sounds, regular rate and rhythm with no murmurs and no digital clubbing, cyanosis, edema, increased warmth or tenderness. Neurologic Neurologic evaluation reveals -alert and oriented x 3 with no impairment of recent or remote memory. Lymphatic Head & Neck General Head & Neck Lymphatics: Bilateral - Description - Normal. Axillary General Axillary Region: Bilateral - Description - Normal. 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 discussed up to 10% upgrade rate to atypia/early cancer with excisional biopsy. she would like to proceed with excision. would like to do after holidays. discussed procedure, risks and recovery.

## 2017-04-13 NOTE — Anesthesia Procedure Notes (Signed)
Procedure Name: LMA Insertion Date/Time: 04/13/2017 1:06 PM Performed by: Maryella Shivers, CRNA Pre-anesthesia Checklist: Patient identified, Emergency Drugs available, Suction available and Patient being monitored Patient Re-evaluated:Patient Re-evaluated prior to induction Oxygen Delivery Method: Circle system utilized Preoxygenation: Pre-oxygenation with 100% oxygen Induction Type: IV induction Ventilation: Mask ventilation without difficulty LMA: LMA inserted LMA Size: 4.0 Number of attempts: 1 Airway Equipment and Method: Bite block Placement Confirmation: positive ETCO2 Tube secured with: Tape Dental Injury: Teeth and Oropharynx as per pre-operative assessment

## 2017-04-13 NOTE — Op Note (Signed)
Preoperative diagnosesright breast calcifications with core biopsy showing radial scar Postoperative diagnosis: Same as above Procedure: Rightbreast seed guided excisional biopsy Surgeon: Dr. Serita Grammes Anesthesia: Gen. Estimated blood loss: minimal Complications: None Drains: None Specimens: Rightbreast tissue marked with paint Sponge and needle count correct at completion Disposition to recovery stable  Indications: This is a52 yof with right breast calcifications on screening mammogram. The core biopsy is a radial scar.  She was referred for consideration of excision. We have discussed options and have elected to proceed with seed guided excision.    Procedure: After informed consent was obtained she was then taken to the operating room. She was given antibiotics.Sequential compression devices were on her legs. She was placed under general anesthesia without complication. Her chestwas then prepped and draped in the standard sterile surgical fashion. A surgical timeout was then performed.   The seed was in thelateralright breast.I infiltrated marcaine and made a curvilinear incision in the lower outer quadrant.I then used the neoprobe to guide excision of the seedand surrounding tissue.Mammogram confirmed removal of seed and the clip although the clip was not at site of lesion. This was then all sent to pathology. Hemostasis was observed.  I closed the breast tissue with a 2-0 Vicryl. The dermis was closed with 3-0 Vicryl and the skin with 4-0 Monocryl.Dermabond and steristrips were placed on the incision. A breast binder was placed. She was transferred to recovery stable

## 2017-04-13 NOTE — Anesthesia Postprocedure Evaluation (Signed)
Anesthesia Post Note  Patient: Sandra Yang  Procedure(s) Performed: RIGHT RADIOACTIVE SEED GUIDED EXCISIONAL BREAST BIOPSY ERAS PATHWAY (Right Breast)     Patient location during evaluation: PACU Anesthesia Type: General Level of consciousness: awake and alert Pain management: pain level controlled Vital Signs Assessment: post-procedure vital signs reviewed and stable Respiratory status: spontaneous breathing, nonlabored ventilation and respiratory function stable Cardiovascular status: blood pressure returned to baseline and stable Postop Assessment: no apparent nausea or vomiting Anesthetic complications: no    Last Vitals:  Vitals:   04/13/17 1400 04/13/17 1415  BP: (!) 135/55 (!) 147/56  Pulse: 66 74  Resp: 18 18  Temp:  36.6 C  SpO2: 98% 99%    Last Pain:  Vitals:   04/13/17 1415  TempSrc: Oral  PainSc: 0-No pain                 Catalina Gravel

## 2017-04-14 ENCOUNTER — Encounter (HOSPITAL_BASED_OUTPATIENT_CLINIC_OR_DEPARTMENT_OTHER): Payer: Self-pay | Admitting: General Surgery

## 2017-04-22 DIAGNOSIS — Z961 Presence of intraocular lens: Secondary | ICD-10-CM | POA: Diagnosis not present

## 2017-04-25 DIAGNOSIS — K5090 Crohn's disease, unspecified, without complications: Secondary | ICD-10-CM | POA: Diagnosis not present

## 2017-04-25 DIAGNOSIS — R69 Illness, unspecified: Secondary | ICD-10-CM | POA: Diagnosis not present

## 2017-04-25 DIAGNOSIS — E785 Hyperlipidemia, unspecified: Secondary | ICD-10-CM | POA: Diagnosis not present

## 2017-04-25 DIAGNOSIS — K219 Gastro-esophageal reflux disease without esophagitis: Secondary | ICD-10-CM | POA: Diagnosis not present

## 2017-04-25 DIAGNOSIS — I10 Essential (primary) hypertension: Secondary | ICD-10-CM | POA: Diagnosis not present

## 2017-05-02 ENCOUNTER — Ambulatory Visit (INDEPENDENT_AMBULATORY_CARE_PROVIDER_SITE_OTHER)
Admission: RE | Admit: 2017-05-02 | Discharge: 2017-05-02 | Disposition: A | Discharge status: Home / Self Care | Payer: Medicare HMO | Source: Ambulatory Visit | Attending: Physician Assistant | Admitting: Physician Assistant

## 2017-05-02 DIAGNOSIS — R140 Abdominal distension (gaseous): Secondary | ICD-10-CM

## 2017-05-02 DIAGNOSIS — K760 Fatty (change of) liver, not elsewhere classified: Secondary | ICD-10-CM | POA: Diagnosis not present

## 2017-05-02 DIAGNOSIS — R1084 Generalized abdominal pain: Secondary | ICD-10-CM

## 2017-05-02 MED ORDER — IOPAMIDOL (ISOVUE-300) INJECTION 61%
100.0000 mL | Freq: Once | INTRAVENOUS | Status: AC | PRN
  Administered 2017-05-02: 100 mL via INTRAVENOUS

## 2017-05-09 ENCOUNTER — Telehealth: Payer: Self-pay | Admitting: Gastroenterology

## 2017-05-09 NOTE — Telephone Encounter (Signed)
The pt has been advised that Anderson Malta is out of the office and will review her results when she returns.  We will call her this week with results.

## 2017-06-28 ENCOUNTER — Other Ambulatory Visit: Payer: Medicare HMO

## 2017-06-28 ENCOUNTER — Encounter: Payer: Self-pay | Admitting: Gastroenterology

## 2017-06-28 ENCOUNTER — Ambulatory Visit: Payer: Medicare HMO | Admitting: Gastroenterology

## 2017-06-28 ENCOUNTER — Encounter (INDEPENDENT_AMBULATORY_CARE_PROVIDER_SITE_OTHER): Payer: Self-pay

## 2017-06-28 VITALS — BP 124/68 | HR 72 | Ht 59.0 in | Wt 129.5 lb

## 2017-06-28 DIAGNOSIS — R197 Diarrhea, unspecified: Secondary | ICD-10-CM | POA: Diagnosis not present

## 2017-06-28 NOTE — Patient Instructions (Signed)
Normal BMI (Body Mass Index- based on height and weight) is between 23 and 30. Your BMI today is Body mass index is 26.16 kg/m. Marland Kitchen Please consider follow up  regarding your BMI with your Primary Care Provider.  Your physician has requested that you go to the basement for the following lab work before leaving today:

## 2017-06-28 NOTE — Progress Notes (Signed)
    History of Present Illness: This is an 84 year old female with diarrhea, oily stools and a 3 lb weight loss.  She relates intermittent problems with loose watery stools.  She has recently noticed that occasionally appear oily.  She is concerned about exocrine pancreatic insufficiency.  She has many days where her bowel movements are normal.  Her appetite is good and weight of 5 or 6 pounds has reversed and she is only 3 pounds lower than her weight one year ago.  Denies recent antibiotic usage.  Current Medications, Allergies, Past Medical History, Past Surgical History, Family History and Social History were reviewed in [employer] record.  Physical Exam: General: Well developed, well nourished, no acute distress Head: Normocephalic and atraumatic Eyes:  sclerae anicteric, EOMI Ears: Normal auditory acuity Mouth: No deformity or lesions Lungs: Clear throughout to auscultation Heart: Regular rate and rhythm; no murmurs, rubs or bruits Abdomen: Soft, non tender and non distended. No masses, hepatosplenomegaly or hernias noted. Normal Bowel sounds Musculoskeletal: Symmetrical with no gross deformities  Pulses:  Normal pulses noted Extremities: No clubbing, cyanosis, edema or deformities noted Neurological: Alert oriented x 4, grossly nonfocal Psychological:  Alert and cooperative. Normal mood and affect  Assessment and Recommendations:  1.  Intermittent diarrhea with oily stools noted.  GI pathogen panel and fecal elastase.  Trial of lactose avoidance for 1 week.  Encouraged to use hyoscyamine more regularly and preventatively before meals.  Further plans pending results of stool studies.

## 2017-07-01 ENCOUNTER — Other Ambulatory Visit: Payer: Medicare HMO

## 2017-07-01 DIAGNOSIS — R197 Diarrhea, unspecified: Secondary | ICD-10-CM

## 2017-07-04 ENCOUNTER — Other Ambulatory Visit: Payer: Self-pay | Admitting: Gastroenterology

## 2017-07-10 LAB — GASTROINTESTINAL PATHOGEN PANEL PCR
C. difficile Tox A/B, PCR: NOT DETECTED
CRYPTOSPORIDIUM, PCR: NOT DETECTED
Campylobacter, PCR: NOT DETECTED
E coli (ETEC) LT/ST PCR: NOT DETECTED
E coli (STEC) stx1/stx2, PCR: NOT DETECTED
E coli 0157, PCR: NOT DETECTED
GIARDIA LAMBLIA, PCR: NOT DETECTED
NOROVIRUS, PCR: NOT DETECTED
ROTAVIRUS, PCR: NOT DETECTED
Salmonella, PCR: NOT DETECTED
Shigella, PCR: NOT DETECTED

## 2017-07-10 LAB — PANCREATIC ELASTASE, FECAL: Pancreatic Elastase-1, Stool: 443 mcg/g

## 2017-07-10 LAB — SPECIMEN ID NOTIFICATION MISSING 2ND ID

## 2017-07-18 ENCOUNTER — Other Ambulatory Visit: Payer: Self-pay | Admitting: Gastroenterology

## 2017-08-16 DIAGNOSIS — L309 Dermatitis, unspecified: Secondary | ICD-10-CM | POA: Diagnosis not present

## 2017-08-16 DIAGNOSIS — B001 Herpesviral vesicular dermatitis: Secondary | ICD-10-CM | POA: Diagnosis not present

## 2017-09-08 DIAGNOSIS — Z85828 Personal history of other malignant neoplasm of skin: Secondary | ICD-10-CM | POA: Diagnosis not present

## 2017-09-08 DIAGNOSIS — Z87891 Personal history of nicotine dependence: Secondary | ICD-10-CM | POA: Diagnosis not present

## 2017-09-08 DIAGNOSIS — K219 Gastro-esophageal reflux disease without esophagitis: Secondary | ICD-10-CM | POA: Diagnosis not present

## 2017-09-08 DIAGNOSIS — I10 Essential (primary) hypertension: Secondary | ICD-10-CM | POA: Diagnosis not present

## 2017-09-08 DIAGNOSIS — K5090 Crohn's disease, unspecified, without complications: Secondary | ICD-10-CM | POA: Diagnosis not present

## 2017-09-08 DIAGNOSIS — Z803 Family history of malignant neoplasm of breast: Secondary | ICD-10-CM | POA: Diagnosis not present

## 2017-09-08 DIAGNOSIS — Z8249 Family history of ischemic heart disease and other diseases of the circulatory system: Secondary | ICD-10-CM | POA: Diagnosis not present

## 2017-09-08 DIAGNOSIS — Z823 Family history of stroke: Secondary | ICD-10-CM | POA: Diagnosis not present

## 2017-09-08 DIAGNOSIS — Z7982 Long term (current) use of aspirin: Secondary | ICD-10-CM | POA: Diagnosis not present

## 2017-09-08 DIAGNOSIS — E785 Hyperlipidemia, unspecified: Secondary | ICD-10-CM | POA: Diagnosis not present

## 2017-11-01 DIAGNOSIS — D2372 Other benign neoplasm of skin of left lower limb, including hip: Secondary | ICD-10-CM | POA: Diagnosis not present

## 2017-11-01 DIAGNOSIS — L821 Other seborrheic keratosis: Secondary | ICD-10-CM | POA: Diagnosis not present

## 2017-11-01 DIAGNOSIS — Z85828 Personal history of other malignant neoplasm of skin: Secondary | ICD-10-CM | POA: Diagnosis not present

## 2017-11-01 DIAGNOSIS — D1800 Hemangioma unspecified site: Secondary | ICD-10-CM | POA: Diagnosis not present

## 2017-11-17 DIAGNOSIS — H52223 Regular astigmatism, bilateral: Secondary | ICD-10-CM | POA: Diagnosis not present

## 2017-11-17 DIAGNOSIS — H524 Presbyopia: Secondary | ICD-10-CM | POA: Diagnosis not present

## 2018-01-09 DIAGNOSIS — E7849 Other hyperlipidemia: Secondary | ICD-10-CM | POA: Diagnosis not present

## 2018-01-09 DIAGNOSIS — I10 Essential (primary) hypertension: Secondary | ICD-10-CM | POA: Diagnosis not present

## 2018-01-09 DIAGNOSIS — R82998 Other abnormal findings in urine: Secondary | ICD-10-CM | POA: Diagnosis not present

## 2018-01-16 ENCOUNTER — Other Ambulatory Visit: Payer: Self-pay | Admitting: Gastroenterology

## 2018-01-16 DIAGNOSIS — I10 Essential (primary) hypertension: Secondary | ICD-10-CM | POA: Diagnosis not present

## 2018-01-16 DIAGNOSIS — N183 Chronic kidney disease, stage 3 (moderate): Secondary | ICD-10-CM | POA: Diagnosis not present

## 2018-01-16 DIAGNOSIS — Z6825 Body mass index (BMI) 25.0-25.9, adult: Secondary | ICD-10-CM | POA: Diagnosis not present

## 2018-01-16 DIAGNOSIS — R252 Cramp and spasm: Secondary | ICD-10-CM | POA: Diagnosis not present

## 2018-01-16 DIAGNOSIS — Z1389 Encounter for screening for other disorder: Secondary | ICD-10-CM | POA: Diagnosis not present

## 2018-01-16 DIAGNOSIS — E7849 Other hyperlipidemia: Secondary | ICD-10-CM | POA: Diagnosis not present

## 2018-01-16 DIAGNOSIS — Z23 Encounter for immunization: Secondary | ICD-10-CM | POA: Diagnosis not present

## 2018-01-16 DIAGNOSIS — K5010 Crohn's disease of large intestine without complications: Secondary | ICD-10-CM | POA: Diagnosis not present

## 2018-01-16 DIAGNOSIS — Z0000 Encounter for general adult medical examination without abnormal findings: Secondary | ICD-10-CM | POA: Diagnosis not present

## 2018-01-19 DIAGNOSIS — Z1212 Encounter for screening for malignant neoplasm of rectum: Secondary | ICD-10-CM | POA: Diagnosis not present

## 2018-01-30 DIAGNOSIS — Z13820 Encounter for screening for osteoporosis: Secondary | ICD-10-CM | POA: Diagnosis not present

## 2018-02-23 DIAGNOSIS — N183 Chronic kidney disease, stage 3 (moderate): Secondary | ICD-10-CM | POA: Diagnosis not present

## 2018-02-23 DIAGNOSIS — I10 Essential (primary) hypertension: Secondary | ICD-10-CM | POA: Diagnosis not present

## 2018-02-23 DIAGNOSIS — E7849 Other hyperlipidemia: Secondary | ICD-10-CM | POA: Diagnosis not present

## 2018-02-23 DIAGNOSIS — Z6824 Body mass index (BMI) 24.0-24.9, adult: Secondary | ICD-10-CM | POA: Diagnosis not present

## 2018-02-23 DIAGNOSIS — R252 Cramp and spasm: Secondary | ICD-10-CM | POA: Diagnosis not present

## 2018-03-21 ENCOUNTER — Other Ambulatory Visit: Payer: Self-pay | Admitting: Gastroenterology

## 2018-04-07 ENCOUNTER — Other Ambulatory Visit: Payer: Self-pay | Admitting: Gastroenterology

## 2018-04-24 DIAGNOSIS — R69 Illness, unspecified: Secondary | ICD-10-CM | POA: Diagnosis not present

## 2018-04-24 DIAGNOSIS — E785 Hyperlipidemia, unspecified: Secondary | ICD-10-CM | POA: Diagnosis not present

## 2018-04-24 DIAGNOSIS — I10 Essential (primary) hypertension: Secondary | ICD-10-CM | POA: Diagnosis not present

## 2018-04-24 DIAGNOSIS — K219 Gastro-esophageal reflux disease without esophagitis: Secondary | ICD-10-CM | POA: Diagnosis not present

## 2018-04-28 DIAGNOSIS — Z961 Presence of intraocular lens: Secondary | ICD-10-CM | POA: Diagnosis not present

## 2018-04-28 DIAGNOSIS — H26493 Other secondary cataract, bilateral: Secondary | ICD-10-CM | POA: Diagnosis not present

## 2018-04-28 DIAGNOSIS — H5201 Hypermetropia, right eye: Secondary | ICD-10-CM | POA: Diagnosis not present

## 2018-05-19 ENCOUNTER — Other Ambulatory Visit: Payer: Self-pay | Admitting: Gastroenterology

## 2018-06-13 DIAGNOSIS — H5203 Hypermetropia, bilateral: Secondary | ICD-10-CM | POA: Diagnosis not present

## 2018-06-13 DIAGNOSIS — H26493 Other secondary cataract, bilateral: Secondary | ICD-10-CM | POA: Diagnosis not present

## 2018-06-13 DIAGNOSIS — H353111 Nonexudative age-related macular degeneration, right eye, early dry stage: Secondary | ICD-10-CM | POA: Diagnosis not present

## 2018-06-13 DIAGNOSIS — H353131 Nonexudative age-related macular degeneration, bilateral, early dry stage: Secondary | ICD-10-CM | POA: Diagnosis not present

## 2018-06-13 DIAGNOSIS — H353121 Nonexudative age-related macular degeneration, left eye, early dry stage: Secondary | ICD-10-CM | POA: Diagnosis not present

## 2018-06-13 DIAGNOSIS — H52223 Regular astigmatism, bilateral: Secondary | ICD-10-CM | POA: Diagnosis not present

## 2018-07-14 ENCOUNTER — Other Ambulatory Visit: Payer: Self-pay | Admitting: Gastroenterology

## 2018-07-17 ENCOUNTER — Other Ambulatory Visit: Payer: Self-pay | Admitting: Gastroenterology

## 2018-08-22 DIAGNOSIS — Z7722 Contact with and (suspected) exposure to environmental tobacco smoke (acute) (chronic): Secondary | ICD-10-CM | POA: Diagnosis not present

## 2018-08-22 DIAGNOSIS — I10 Essential (primary) hypertension: Secondary | ICD-10-CM | POA: Diagnosis not present

## 2018-08-22 DIAGNOSIS — Z87891 Personal history of nicotine dependence: Secondary | ICD-10-CM | POA: Diagnosis not present

## 2018-08-22 DIAGNOSIS — E785 Hyperlipidemia, unspecified: Secondary | ICD-10-CM | POA: Diagnosis not present

## 2018-08-22 DIAGNOSIS — K219 Gastro-esophageal reflux disease without esophagitis: Secondary | ICD-10-CM | POA: Diagnosis not present

## 2018-08-22 DIAGNOSIS — K5090 Crohn's disease, unspecified, without complications: Secondary | ICD-10-CM | POA: Diagnosis not present

## 2018-09-22 ENCOUNTER — Other Ambulatory Visit: Payer: Self-pay | Admitting: Gastroenterology

## 2018-11-09 DIAGNOSIS — Z85828 Personal history of other malignant neoplasm of skin: Secondary | ICD-10-CM | POA: Diagnosis not present

## 2018-11-09 DIAGNOSIS — L821 Other seborrheic keratosis: Secondary | ICD-10-CM | POA: Diagnosis not present

## 2018-11-09 DIAGNOSIS — D2372 Other benign neoplasm of skin of left lower limb, including hip: Secondary | ICD-10-CM | POA: Diagnosis not present

## 2018-12-12 DIAGNOSIS — H52223 Regular astigmatism, bilateral: Secondary | ICD-10-CM | POA: Diagnosis not present

## 2018-12-20 DIAGNOSIS — R69 Illness, unspecified: Secondary | ICD-10-CM | POA: Diagnosis not present

## 2019-01-08 ENCOUNTER — Other Ambulatory Visit: Payer: Self-pay | Admitting: Gastroenterology

## 2019-01-09 ENCOUNTER — Other Ambulatory Visit: Payer: Self-pay | Admitting: Gastroenterology

## 2019-01-15 ENCOUNTER — Encounter: Payer: Self-pay | Admitting: Gastroenterology

## 2019-01-15 DIAGNOSIS — E7849 Other hyperlipidemia: Secondary | ICD-10-CM | POA: Diagnosis not present

## 2019-01-22 ENCOUNTER — Encounter: Payer: Self-pay | Admitting: Gastroenterology

## 2019-01-22 DIAGNOSIS — R252 Cramp and spasm: Secondary | ICD-10-CM | POA: Diagnosis not present

## 2019-01-22 DIAGNOSIS — I10 Essential (primary) hypertension: Secondary | ICD-10-CM | POA: Diagnosis not present

## 2019-01-22 DIAGNOSIS — R82998 Other abnormal findings in urine: Secondary | ICD-10-CM | POA: Diagnosis not present

## 2019-01-22 DIAGNOSIS — N1832 Chronic kidney disease, stage 3b: Secondary | ICD-10-CM | POA: Diagnosis not present

## 2019-01-22 DIAGNOSIS — N183 Chronic kidney disease, stage 3 unspecified: Secondary | ICD-10-CM | POA: Diagnosis not present

## 2019-01-22 DIAGNOSIS — Z1331 Encounter for screening for depression: Secondary | ICD-10-CM | POA: Diagnosis not present

## 2019-01-22 DIAGNOSIS — K5010 Crohn's disease of large intestine without complications: Secondary | ICD-10-CM | POA: Diagnosis not present

## 2019-01-22 DIAGNOSIS — Z0000 Encounter for general adult medical examination without abnormal findings: Secondary | ICD-10-CM | POA: Diagnosis not present

## 2019-01-22 DIAGNOSIS — E785 Hyperlipidemia, unspecified: Secondary | ICD-10-CM | POA: Diagnosis not present

## 2019-01-22 DIAGNOSIS — I129 Hypertensive chronic kidney disease with stage 1 through stage 4 chronic kidney disease, or unspecified chronic kidney disease: Secondary | ICD-10-CM | POA: Diagnosis not present

## 2019-01-22 DIAGNOSIS — Z1339 Encounter for screening examination for other mental health and behavioral disorders: Secondary | ICD-10-CM | POA: Diagnosis not present

## 2019-01-24 DIAGNOSIS — Z1212 Encounter for screening for malignant neoplasm of rectum: Secondary | ICD-10-CM | POA: Diagnosis not present

## 2019-01-25 DIAGNOSIS — H00025 Hordeolum internum left lower eyelid: Secondary | ICD-10-CM | POA: Diagnosis not present

## 2019-02-20 ENCOUNTER — Other Ambulatory Visit: Payer: Self-pay | Admitting: Internal Medicine

## 2019-02-20 DIAGNOSIS — Z1231 Encounter for screening mammogram for malignant neoplasm of breast: Secondary | ICD-10-CM

## 2019-02-21 ENCOUNTER — Ambulatory Visit: Payer: Medicare HMO

## 2019-03-06 ENCOUNTER — Ambulatory Visit
Admission: RE | Admit: 2019-03-06 | Discharge: 2019-03-06 | Disposition: A | Discharge status: Home / Self Care | Payer: Medicare HMO | Source: Ambulatory Visit | Attending: Internal Medicine | Admitting: Internal Medicine

## 2019-03-06 ENCOUNTER — Other Ambulatory Visit: Payer: Self-pay

## 2019-03-06 DIAGNOSIS — Z1231 Encounter for screening mammogram for malignant neoplasm of breast: Secondary | ICD-10-CM

## 2019-04-23 DIAGNOSIS — E785 Hyperlipidemia, unspecified: Secondary | ICD-10-CM | POA: Diagnosis not present

## 2019-04-23 DIAGNOSIS — K219 Gastro-esophageal reflux disease without esophagitis: Secondary | ICD-10-CM | POA: Diagnosis not present

## 2019-04-23 DIAGNOSIS — R69 Illness, unspecified: Secondary | ICD-10-CM | POA: Diagnosis not present

## 2019-04-23 DIAGNOSIS — I10 Essential (primary) hypertension: Secondary | ICD-10-CM | POA: Diagnosis not present

## 2019-05-11 DIAGNOSIS — H5201 Hypermetropia, right eye: Secondary | ICD-10-CM | POA: Diagnosis not present

## 2019-05-11 DIAGNOSIS — H26493 Other secondary cataract, bilateral: Secondary | ICD-10-CM | POA: Diagnosis not present

## 2019-05-13 ENCOUNTER — Ambulatory Visit: Payer: Medicare HMO | Attending: Internal Medicine

## 2019-05-13 DIAGNOSIS — Z23 Encounter for immunization: Secondary | ICD-10-CM | POA: Insufficient documentation

## 2019-05-13 NOTE — Progress Notes (Signed)
   Covid-19 Vaccination Clinic  Name:  Sandra Yang    MRN: [id number] DOB: 06/16/1943  05/13/2019  [name] was observed post Covid-19 immunization for 15 minutes without incidence. She was provided with Vaccine Information Sheet and instruction to access the V-Safe system.   [name] was instructed to call 911 with any severe reactions post vaccine: Marland Kitchen Difficulty breathing  . Swelling of your face and throat  . A fast heartbeat  . A bad rash all over your body  . Dizziness and weakness    Immunizations Administered    Name Date Dose VIS Date Route   Pfizer COVID-19 Vaccine 05/13/2019  3:57 PM 0.3 mL 03/16/2019 Intramuscular   Manufacturer: [place]annie   Lot: [id number]   [place]: [id number]

## 2019-05-22 DIAGNOSIS — H26492 Other secondary cataract, left eye: Secondary | ICD-10-CM | POA: Diagnosis not present

## 2019-05-29 ENCOUNTER — Ambulatory Visit: Payer: Self-pay

## 2019-06-07 ENCOUNTER — Ambulatory Visit: Payer: Medicare HMO | Attending: Internal Medicine

## 2019-06-07 DIAGNOSIS — Z23 Encounter for immunization: Secondary | ICD-10-CM | POA: Insufficient documentation

## 2019-06-07 NOTE — Progress Notes (Signed)
   Covid-19 Vaccination Clinic  Name:  Sandra Yang    MRN: [id number] DOB: [birth year]  06/07/2019  [name] was observed post Covid-19 immunization for 15 minutes without incident. She was provided with Vaccine Information Sheet and instruction to access the V-Safe system.   [name] was instructed to call 911 with any severe reactions post vaccine: Marland Kitchen Difficulty breathing  . Swelling of face and throat  . A fast heartbeat  . A bad rash all over body  . Dizziness and weakness   Immunizations Administered    Name Date Dose VIS Date Route   Pfizer COVID-19 Vaccine 06/07/2019  2:25 PM 0.3 mL 03/16/2019 Intramuscular   Manufacturer: [place]   Lot: [id number]   [place]: [id number]

## 2019-07-09 ENCOUNTER — Other Ambulatory Visit: Payer: Self-pay | Admitting: Gastroenterology

## 2019-07-10 ENCOUNTER — Telehealth: Payer: Self-pay | Admitting: Gastroenterology

## 2019-07-10 MED ORDER — OMEPRAZOLE 20 MG PO CPDR: 20.0000 mg | DELAYED_RELEASE_CAPSULE | Freq: Every day | ORAL | 0 refills | Status: DC

## 2019-07-10 NOTE — Telephone Encounter (Signed)
Prescription sent to patient's pharmacy until scheduled appt.

## 2019-07-10 NOTE — Telephone Encounter (Signed)
Pt is scheduled for OV 08/09/19 and requested a refill for omeprazole.

## 2019-08-09 ENCOUNTER — Encounter: Payer: Self-pay | Admitting: Gastroenterology

## 2019-08-09 ENCOUNTER — Ambulatory Visit: Payer: Medicare HMO | Admitting: Gastroenterology

## 2019-08-09 VITALS — BP 128/70 | HR 84 | Temp 97.8°F | Ht 59.5 in | Wt 129.4 lb

## 2019-08-09 DIAGNOSIS — K5010 Crohn's disease of large intestine without complications: Secondary | ICD-10-CM | POA: Diagnosis not present

## 2019-08-09 DIAGNOSIS — K219 Gastro-esophageal reflux disease without esophagitis: Secondary | ICD-10-CM

## 2019-08-09 MED ORDER — OMEPRAZOLE 20 MG PO CPDR: 20.0000 mg | DELAYED_RELEASE_CAPSULE | Freq: Every day | ORAL | 11 refills | Status: DC

## 2019-08-09 MED ORDER — HYOSCYAMINE SULFATE 0.125 MG SL SUBL: SUBLINGUAL_TABLET | SUBLINGUAL | 2 refills | Status: DC

## 2019-08-09 MED ORDER — MESALAMINE 1.2 G PO TBEC: DELAYED_RELEASE_TABLET | ORAL | 11 refills | Status: DC

## 2019-08-09 NOTE — Progress Notes (Signed)
    History of Present Illness: This is an 86 year old female returning for follow-up of Crohn's colitis, IBS, GERD. Last office visit March 2019.  She relates that Lialda 2.4 g daily tends to cause fecal urgency and diarrhea.  She reduced to 1.2 g daily and notes the symptoms have improved.  She occasionally gets mild generalized abdominal cramping associated with diarrhea.  These symptoms respond well to hyoscyamine.  She avoids certain foods in her diet which is also effective in controlling symptoms.  Reflux symptoms under excellent control.  She states she had blood work performed by Dr. Philip Aspen in February she does not recall any abnormal results.  Current Medications, Allergies, Past Medical History, Past Surgical History, Family History and Social History were reviewed in [employer] record.   Physical Exam: General: Well developed, well nourished, no acute distress Head: Normocephalic and atraumatic Eyes:  sclerae anicteric, EOMI Ears: Normal auditory acuity Mouth: Not examined, mask on during Covid-19 pandemic Lungs: Clear throughout to auscultation Heart: Regular rate and rhythm; no murmurs, rubs or bruits Abdomen: Soft, non tender and non distended. No masses, hepatosplenomegaly or hernias noted. Normal Bowel sounds Rectal: Not done Musculoskeletal: Symmetrical with no gross deformities  Pulses:  Normal pulses noted Extremities: No clubbing, cyanosis, edema or deformities noted Neurological: Alert oriented x 4, grossly nonfocal Psychological:  Alert and cooperative. Normal mood and affect   Assessment and Recommendations:  1.  Crohns's colitis.  Request blood work from Dr. Shon Baton office.  Liada 1.2 g daily.  REV in 1 year.  2.  IBS-D.  Avoid foods that trigger symptoms.  Continue Levsin 1-2 q4h prn abdominal pain, abdominal bloating. REV in 1 year.   3. History of TVA. Last colonoscopy in 2016. No longer in surveillance due to age.

## 2019-08-09 NOTE — Patient Instructions (Signed)
We have sent the following medications to your pharmacy for you to pick up at your convenience:omeprazole, Lialda and Levsin.   Thank you for choosing me and [facility].  Pricilla Riffle. Dagoberto Ligas., MD., Marval Regal

## 2019-09-06 DIAGNOSIS — R252 Cramp and spasm: Secondary | ICD-10-CM | POA: Diagnosis not present

## 2019-09-06 DIAGNOSIS — N1832 Chronic kidney disease, stage 3b: Secondary | ICD-10-CM | POA: Diagnosis not present

## 2019-09-06 DIAGNOSIS — R197 Diarrhea, unspecified: Secondary | ICD-10-CM | POA: Diagnosis not present

## 2019-09-06 DIAGNOSIS — I129 Hypertensive chronic kidney disease with stage 1 through stage 4 chronic kidney disease, or unspecified chronic kidney disease: Secondary | ICD-10-CM | POA: Diagnosis not present

## 2019-09-06 DIAGNOSIS — Z1331 Encounter for screening for depression: Secondary | ICD-10-CM | POA: Diagnosis not present

## 2019-10-16 ENCOUNTER — Telehealth: Payer: Self-pay | Admitting: Gastroenterology

## 2019-10-16 MED ORDER — OMEPRAZOLE 20 MG PO CPDR: 20.0000 mg | DELAYED_RELEASE_CAPSULE | Freq: Every day | ORAL | 3 refills | Status: DC

## 2019-10-16 NOTE — Telephone Encounter (Signed)
Prescription sent to patient's pharmacy for 90 day supply.

## 2019-11-22 DIAGNOSIS — L820 Inflamed seborrheic keratosis: Secondary | ICD-10-CM | POA: Diagnosis not present

## 2019-11-22 DIAGNOSIS — Z85828 Personal history of other malignant neoplasm of skin: Secondary | ICD-10-CM | POA: Diagnosis not present

## 2019-11-22 DIAGNOSIS — D2372 Other benign neoplasm of skin of left lower limb, including hip: Secondary | ICD-10-CM | POA: Diagnosis not present

## 2019-11-22 DIAGNOSIS — L578 Other skin changes due to chronic exposure to nonionizing radiation: Secondary | ICD-10-CM | POA: Diagnosis not present

## 2019-11-22 DIAGNOSIS — L821 Other seborrheic keratosis: Secondary | ICD-10-CM | POA: Diagnosis not present

## 2019-11-22 DIAGNOSIS — D225 Melanocytic nevi of trunk: Secondary | ICD-10-CM | POA: Diagnosis not present

## 2020-01-17 DIAGNOSIS — E785 Hyperlipidemia, unspecified: Secondary | ICD-10-CM | POA: Diagnosis not present

## 2020-01-21 ENCOUNTER — Other Ambulatory Visit: Payer: Self-pay | Admitting: Internal Medicine

## 2020-01-21 DIAGNOSIS — Z1231 Encounter for screening mammogram for malignant neoplasm of breast: Secondary | ICD-10-CM

## 2020-01-24 DIAGNOSIS — E785 Hyperlipidemia, unspecified: Secondary | ICD-10-CM | POA: Diagnosis not present

## 2020-01-24 DIAGNOSIS — Z23 Encounter for immunization: Secondary | ICD-10-CM | POA: Diagnosis not present

## 2020-01-24 DIAGNOSIS — Z0000 Encounter for general adult medical examination without abnormal findings: Secondary | ICD-10-CM | POA: Diagnosis not present

## 2020-01-24 DIAGNOSIS — I129 Hypertensive chronic kidney disease with stage 1 through stage 4 chronic kidney disease, or unspecified chronic kidney disease: Secondary | ICD-10-CM | POA: Diagnosis not present

## 2020-01-24 DIAGNOSIS — R252 Cramp and spasm: Secondary | ICD-10-CM | POA: Diagnosis not present

## 2020-01-24 DIAGNOSIS — K5010 Crohn's disease of large intestine without complications: Secondary | ICD-10-CM | POA: Diagnosis not present

## 2020-01-25 DIAGNOSIS — R82998 Other abnormal findings in urine: Secondary | ICD-10-CM | POA: Diagnosis not present

## 2020-01-25 DIAGNOSIS — I10 Essential (primary) hypertension: Secondary | ICD-10-CM | POA: Diagnosis not present

## 2020-01-29 DIAGNOSIS — H524 Presbyopia: Secondary | ICD-10-CM | POA: Diagnosis not present

## 2020-03-06 ENCOUNTER — Ambulatory Visit
Admission: RE | Admit: 2020-03-06 | Discharge: 2020-03-06 | Disposition: A | Discharge status: Home / Self Care | Payer: Medicare HMO | Source: Ambulatory Visit | Attending: Internal Medicine | Admitting: Internal Medicine

## 2020-03-06 ENCOUNTER — Other Ambulatory Visit: Payer: Self-pay

## 2020-03-06 DIAGNOSIS — Z1231 Encounter for screening mammogram for malignant neoplasm of breast: Secondary | ICD-10-CM | POA: Diagnosis not present

## 2020-03-20 DIAGNOSIS — Z1212 Encounter for screening for malignant neoplasm of rectum: Secondary | ICD-10-CM | POA: Diagnosis not present

## 2020-04-28 DIAGNOSIS — Z20822 Contact with and (suspected) exposure to covid-19: Secondary | ICD-10-CM | POA: Diagnosis not present

## 2020-06-05 DIAGNOSIS — H26491 Other secondary cataract, right eye: Secondary | ICD-10-CM | POA: Diagnosis not present

## 2020-06-05 DIAGNOSIS — Z961 Presence of intraocular lens: Secondary | ICD-10-CM | POA: Diagnosis not present

## 2020-06-05 DIAGNOSIS — H52203 Unspecified astigmatism, bilateral: Secondary | ICD-10-CM | POA: Diagnosis not present

## 2020-06-12 DIAGNOSIS — H26491 Other secondary cataract, right eye: Secondary | ICD-10-CM | POA: Diagnosis not present

## 2020-08-01 ENCOUNTER — Encounter (HOSPITAL_COMMUNITY): Payer: Self-pay

## 2020-08-01 ENCOUNTER — Emergency Department (HOSPITAL_COMMUNITY): Payer: Medicare HMO

## 2020-08-01 ENCOUNTER — Other Ambulatory Visit: Payer: Self-pay

## 2020-08-01 ENCOUNTER — Emergency Department (HOSPITAL_COMMUNITY)
Admission: EM | Admit: 2020-08-01 | Discharge: 2020-08-01 | Disposition: A | Discharge status: Home / Self Care | Payer: Medicare HMO | Attending: Emergency Medicine | Admitting: Emergency Medicine

## 2020-08-01 DIAGNOSIS — M25511 Pain in right shoulder: Secondary | ICD-10-CM | POA: Diagnosis not present

## 2020-08-01 DIAGNOSIS — Z79899 Other long term (current) drug therapy: Secondary | ICD-10-CM | POA: Diagnosis not present

## 2020-08-01 DIAGNOSIS — I10 Essential (primary) hypertension: Secondary | ICD-10-CM | POA: Insufficient documentation

## 2020-08-01 DIAGNOSIS — Z7982 Long term (current) use of aspirin: Secondary | ICD-10-CM | POA: Insufficient documentation

## 2020-08-01 DIAGNOSIS — Z87891 Personal history of nicotine dependence: Secondary | ICD-10-CM | POA: Diagnosis not present

## 2020-08-01 DIAGNOSIS — S0990XA Unspecified injury of head, initial encounter: Secondary | ICD-10-CM | POA: Diagnosis present

## 2020-08-01 DIAGNOSIS — W208XXA Other cause of strike by thrown, projected or falling object, initial encounter: Secondary | ICD-10-CM | POA: Insufficient documentation

## 2020-08-01 DIAGNOSIS — S0003XA Contusion of scalp, initial encounter: Secondary | ICD-10-CM | POA: Diagnosis not present

## 2020-08-01 MED ORDER — BACITRACIN ZINC 500 UNIT/GM EX OINT: TOPICAL_OINTMENT | Freq: Once | CUTANEOUS | Status: AC

## 2020-08-01 NOTE — ED Notes (Signed)
Bacitracin applied and head wrapped with non adherent pad and kerlix

## 2020-08-01 NOTE — ED Provider Notes (Signed)
[facility] DEPT Provider Note   CSN: [id number] Arrival date & time: 08/01/20  1844     History Chief Complaint  Patient presents with  . Fall    Sandra Yang is a 87 y.o. female history of hypertension, Crohn's disease here presenting with fall.  Patient states that she was trying to rescue a bird and had a mechanical fall and hit her right forehead on the door post.  Also hit the right shoulder as well.  Patient was noted to have with abrasion of the right forehead.  She also complains of right shoulder pain as well.  Denies any loss of consciousness.  Patient states that she is allergic to tetanus shot.  The history is provided by the patient.       Past Medical History:  Diagnosis Date  . Allergy   . Crohn's colitis ([place]) 10/2008  . GERD (gastroesophageal reflux disease)   . Hypertension   . 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 Chest pain 04/26/2015  . Hyperlipidemia 04/26/2015  . Essential hypertension 04/26/2015  . Esophageal reflux 11/25/2010  . FLATULENCE-GAS-BLOATING 08/04/2009  . CROHN'S DISEASE-LARGE INTESTINE 12/02/2008  . PERSONAL HX COLONIC POLYPS 12/02/2008    Past Surgical History:  Procedure Laterality Date  . APPENDECTOMY    . BREAST EXCISIONAL BIOPSY Right 2019  . CHOLECYSTECTOMY  2001  . FOOT SURGERY    . HAND SURGERY    . HEMICOLECTOMY  2001  . RADIOACTIVE SEED GUIDED EXCISIONAL BREAST BIOPSY Right 04/13/2017   Procedure: RIGHT RADIOACTIVE SEED GUIDED EXCISIONAL BREAST BIOPSY ERAS PATHWAY;  Surgeon: Rolm Bookbinder, MD;  Location: [facility];  Service: General;  Laterality: Right;  LMA  . VAGINAL HYSTERECTOMY       OB History   No obstetric history on file.     Family History  Problem Relation Age of Onset  . Prostate cancer Father   . Bone cancer Father   . Breast cancer Sister   . Colon cancer Neg Hx   . Stomach  cancer Neg Hx     Social History   Tobacco Use  . Smoking status: Former Smoker    Quit date: 04/05/1980    Years since quitting: 40.3  . Smokeless tobacco: Never Used  Vaping Use  . Vaping Use: Never used  Substance Use Topics  . Alcohol use: Yes    Alcohol/week: 2.0 standard drinks    Types: 2 Glasses of wine per week    Comment: socially  . Drug use: No    Home Medications Prior to Admission medications   Medication Sig Start Date End Date Taking? Authorizing Provider  amLODipine (NORVASC) 5 MG tablet Take 1 tablet (5 mg total) by mouth daily. 04/27/15   Nita Sells, MD  aspirin 81 MG tablet Take 81 mg by mouth daily.      [provider]  Cholecalciferol (VITAMIN D) 2000 UNITS CAPS Take 2,000 Units by mouth daily.     [provider]  fenofibrate 160 MG tablet Take 160 mg by mouth daily.      [provider]  hyoscyamine (LEVSIN SL) 0.125 MG SL tablet PLACE 1-2 TABLET UNDER THE TONGUE EVERY 4 HOURS AS NEEDED FOR ABDOMINAL PAIN 08/09/19   Ladene Artist, MD  KRILL OIL PO Take 1 tablet by mouth daily.    [provider]  mesalamine (LIALDA) 1.2 g EC tablet TAKE  ONE TABLETS BY MOUTH DAILY WITH BREAKFAST 08/09/19   Ladene Artist, MD  metoprolol succinate (TOPROL-XL) 25 MG 24 hr tablet Take 25 mg by mouth daily.    [provider]  omeprazole (PRILOSEC) 20 MG capsule Take 1 capsule (20 mg total) by mouth daily. 10/16/19   Ladene Artist, MD  triamterene-hydrochlorothiazide (MAXZIDE-25) 37.5-25 MG per tablet Take 1 tablet by mouth daily.  11/09/11   [provider]    Allergies    Erythromycin, Tetanus toxoids, and Tape  Review of Systems   Review of Systems  Musculoskeletal:       R shoulder pain   Neurological: Positive for headaches.    Physical Exam Updated Vital Signs BP (!) 227/60   Pulse (!) 59   Temp 98.3 F (36.8 C)   Resp 18   Ht 5' (1.524 m)   Wt 56.7 kg   SpO2 100%   BMI 24.41 kg/m    Physical Exam Vitals and nursing note reviewed.  HENT:     Head:     Comments: Right forehead hematoma with an abrasion.  No obvious laceration    Mouth/Throat:     Mouth: Mucous membranes are moist.  Eyes:     Extraocular Movements: Extraocular movements intact.     Pupils: Pupils are equal, round, and reactive to light.  Neck:     Comments: No midline tenderness Cardiovascular:     Rate and Rhythm: Normal rate and regular rhythm.     Pulses: Normal pulses.     Heart sounds: Normal heart sounds.  Pulmonary:     Effort: Pulmonary effort is normal.     Breath sounds: Normal breath sounds.  Abdominal:     General: Abdomen is flat.     Palpations: Abdomen is soft.  Musculoskeletal:     Cervical back: Normal range of motion and neck supple.     Comments: Mild right shoulder tenderness but normal range of motion of the right shoulder.  No obvious deformity.  Patient has ecchymosis around the right thumb area but has no bony tenderness has normal range of motion of the thumb.  No midline spinal tenderness.  No other extremity trauma  Skin:    General: Skin is warm.     Capillary Refill: Capillary refill takes less than 2 seconds.  Neurological:     General: No focal deficit present.     Mental Status: She is alert and oriented to person, place, and time.  Psychiatric:        Mood and Affect: Mood normal.        Behavior: Behavior normal.     ED Results / Procedures / Treatments   Labs (all labs ordered are listed, but only abnormal results are displayed) Labs Reviewed  BASIC METABOLIC PANEL  CBC WITH DIFFERENTIAL/PLATELET    EKG None  Radiology DG Shoulder Right  Result Date: 08/01/2020 CLINICAL DATA:  Right shoulder pain. EXAM: RIGHT SHOULDER - 2+ VIEW COMPARISON:  None. FINDINGS: There is no evidence of fracture or dislocation. There is no evidence of arthropathy or other focal bone abnormality. Soft tissues are unremarkable. IMPRESSION: Negative. Electronically  Signed   By: Constance Holster M.D.   On: 08/01/2020 20:28   CT Head Wo Contrast  Result Date: 08/01/2020 CLINICAL DATA:  Facial trauma.  Status post fall EXAM: CT HEAD WITHOUT CONTRAST CT MAXILLOFACIAL WITHOUT CONTRAST TECHNIQUE: Multidetector CT imaging of the head and maxillofacial structures were performed using the standard protocol without intravenous  contrast. Multiplanar CT image reconstructions of the maxillofacial structures were also generated. COMPARISON:  None. FINDINGS: CT HEAD FINDINGS Brain: Cerebral ventricle sizes are concordant with the degree of cerebral volume loss. Bilateral chronic hygromas. No evidence of large-territorial acute infarction. No parenchymal hemorrhage. No mass lesion. No extra-axial collection. No mass effect or midline shift. No hydrocephalus. Basilar cisterns are patent. Vascular: No hyperdense vessel. Skull: No acute fracture or focal lesion. Other: None. CT MAXILLOFACIAL FINDINGS Osseous: No fracture or mandibular dislocation. No destructive process. Sinuses/Orbits: Sinus surgery bilaterally noted. Paranasal sinuses and mastoid air cells are clear. The orbits are unremarkable. Soft tissues: 8 mm right frontal scalp hematoma formation. Other: Visualized upper cervical spine demonstrates degenerative changes. IMPRESSION: 1. Subcentimeter right frontal scalp hematoma formation with no underlying acute calvarial fracture or acute intracranial abnormality. 2. No acute displaced facial fracture. Electronically Signed   By: Iven Finn M.D.   On: 08/01/2020 20:36   CT Maxillofacial WO CM  Result Date: 08/01/2020 CLINICAL DATA:  Facial trauma.  Status post fall EXAM: CT HEAD WITHOUT CONTRAST CT MAXILLOFACIAL WITHOUT CONTRAST TECHNIQUE: Multidetector CT imaging of the head and maxillofacial structures were performed using the standard protocol without intravenous contrast. Multiplanar CT image reconstructions of the maxillofacial structures were also generated.  COMPARISON:  None. FINDINGS: CT HEAD FINDINGS Brain: Cerebral ventricle sizes are concordant with the degree of cerebral volume loss. Bilateral chronic hygromas. No evidence of large-territorial acute infarction. No parenchymal hemorrhage. No mass lesion. No extra-axial collection. No mass effect or midline shift. No hydrocephalus. Basilar cisterns are patent. Vascular: No hyperdense vessel. Skull: No acute fracture or focal lesion. Other: None. CT MAXILLOFACIAL FINDINGS Osseous: No fracture or mandibular dislocation. No destructive process. Sinuses/Orbits: Sinus surgery bilaterally noted. Paranasal sinuses and mastoid air cells are clear. The orbits are unremarkable. Soft tissues: 8 mm right frontal scalp hematoma formation. Other: Visualized upper cervical spine demonstrates degenerative changes. IMPRESSION: 1. Subcentimeter right frontal scalp hematoma formation with no underlying acute calvarial fracture or acute intracranial abnormality. 2. No acute displaced facial fracture. Electronically Signed   By: Iven Finn M.D.   On: 08/01/2020 20:36    Procedures Procedures   Medications Ordered in ED Medications - No data to display  ED Course  I have reviewed the triage vital signs and the nursing notes.  Pertinent labs & imaging results that were available during my care of the patient were reviewed by me and considered in my medical decision making (see chart for details).    MDM Rules/Calculators/A&P                         Sandra Yang is a 87 y.o. female here presenting with fall.  She has right scalp hematoma.  She is also hypertensive.  We will get a CT head to rule out bleed and right shoulder x-rays  8:49 PM CT head showed no bleed.  X-ray did not show any fracture.  Of note, patient's blood pressure was 220 on arrival and is now 180.  She has a history of hypertension and is on hydrochlorothiazide and told her to take an extra dose.  She also is allergic to tetanus vaccine so we  will hold off on tetanus shot   Final Clinical Impression(s) / ED Diagnoses Final diagnoses:  None    Rx / DC Orders ED Discharge Orders    None       Drenda Freeze, MD 08/01/20 2050

## 2020-08-01 NOTE — Discharge Instructions (Signed)
You have a hematoma on your scalp.  Your CT today did not show any fractures or bleeding inside your brain  Take Tylenol for pain.  Expect the area to be more black and blue  Please apply bacitracin twice daily to help prevent infection.   See your doctor for follow-up  Return to ER if you have worse headaches, vomiting

## 2020-08-01 NOTE — ED Triage Notes (Signed)
Pt reports falling in her room while rescuing a bird hitting head on the doorway. Abrasion to forehead.

## 2020-08-01 NOTE — ED Triage Notes (Signed)
Emergency Medicine Provider Triage Evaluation Note  Sandra Yang , a 87 y.o. female  was evaluated in triage.  Pt complains of had a mechanical fall hit her face, endorses slight headache, right shoulder pain.  Denies paresthesia or weakness upper lower extremities, denies visual changes.  Not on anticoagulant.  Review of Systems  Positive: Headaches, right shoulder pain Negative: Denies paresthesia or weakness upper lower extremities.  Physical Exam  BP (!) 227/60   Pulse (!) 59   Temp 98.3 F (36.8 C)   Resp 18   Ht 5' (1.524 m)   Wt 56.7 kg   SpO2 100%   BMI 24.41 kg/m  Gen:   Awake, no distress   HEENT:  Atraumatic  Resp:  Normal effort  Cardiac:  Normal rate  MSK:   Moves extremities without difficulty  Neuro:  Speech clear   Medical Decision Making  Medically screening exam initiated at 7:52 PM.  Appropriate orders placed.  Sandra Yang was informed that the remainder of the evaluation will be completed by another provider, this initial triage assessment does not replace that evaluation, and the importance of remaining in the ED until their evaluation is complete.  Clinical Impression  Patient presents with headaches right shoulder pain, lab work imaging abdomen pain, patient will need further lab work-up here in the emergency department   Marcello Fennel, PA-C 08/01/20 1953

## 2020-08-11 DIAGNOSIS — J340 Abscess, furuncle and carbuncle of nose: Secondary | ICD-10-CM | POA: Diagnosis not present

## 2020-08-11 DIAGNOSIS — R040 Epistaxis: Secondary | ICD-10-CM | POA: Diagnosis not present

## 2020-08-14 ENCOUNTER — Other Ambulatory Visit: Payer: Self-pay | Admitting: Gastroenterology

## 2020-08-15 ENCOUNTER — Other Ambulatory Visit: Payer: Self-pay | Admitting: Gastroenterology

## 2020-08-28 ENCOUNTER — Telehealth: Payer: Self-pay | Admitting: Gastroenterology

## 2020-08-28 MED ORDER — MESALAMINE 1.2 G PO TBEC: 1.2000 g | DELAYED_RELEASE_TABLET | Freq: Every day | ORAL | 0 refills | Status: DC

## 2020-08-28 NOTE — Telephone Encounter (Signed)
Inbound call from patient. Need medication refill for Mesalamine. Would like a 2 month supply so she doen't have to go to the pharmacy very often. Sent to [facility]. Have appointment schedule with Dr. Fuller Plan 10/09/20

## 2020-08-28 NOTE — Telephone Encounter (Signed)
Informed patient I will send her medication to the pharmacy and to keep appt on 10/09/20.

## 2020-10-08 ENCOUNTER — Other Ambulatory Visit: Payer: Self-pay | Admitting: Gastroenterology

## 2020-10-09 ENCOUNTER — Ambulatory Visit: Payer: Medicare HMO | Admitting: Gastroenterology

## 2020-10-09 ENCOUNTER — Encounter: Payer: Self-pay | Admitting: Gastroenterology

## 2020-10-09 VITALS — BP 140/70 | HR 58 | Ht 60.0 in | Wt 124.0 lb

## 2020-10-09 DIAGNOSIS — K580 Irritable bowel syndrome with diarrhea: Secondary | ICD-10-CM

## 2020-10-09 DIAGNOSIS — K5010 Crohn's disease of large intestine without complications: Secondary | ICD-10-CM | POA: Diagnosis not present

## 2020-10-09 DIAGNOSIS — K219 Gastro-esophageal reflux disease without esophagitis: Secondary | ICD-10-CM

## 2020-10-09 MED ORDER — GLYCOPYRROLATE 1 MG PO TABS: 1.0000 mg | ORAL_TABLET | Freq: Two times a day (BID) | ORAL | 3 refills | Status: DC | PRN

## 2020-10-09 MED ORDER — MESALAMINE 1.2 G PO TBEC: 1.2000 g | DELAYED_RELEASE_TABLET | Freq: Every day | ORAL | 3 refills | Status: DC

## 2020-10-09 MED ORDER — OMEPRAZOLE 20 MG PO CPDR: 20.0000 mg | DELAYED_RELEASE_CAPSULE | Freq: Every day | ORAL | 3 refills | Status: DC

## 2020-10-09 MED ORDER — HYOSCYAMINE SULFATE 0.125 MG SL SUBL: SUBLINGUAL_TABLET | SUBLINGUAL | 2 refills | Status: DC

## 2020-10-09 NOTE — Progress Notes (Signed)
    History of Present Illness: This is an 87 year old female with Crohn's colitis, IBS, GERD.  She states she has had frequent problems with left-sided abdominal pain and looser stools that have been bothersome for several weeks.  She states she has been under more stress with repairs and workers inside her home for the past 3 months following a water leak.  In addition she had a fall in her home with bruising of her face.  She notes for the past several days since she has recovered from her fall and the repairs in her home were completed that her bowel habits have returned to normal.  Current Medications, Allergies, Past Medical History, Past Surgical History, Family History and Social History were reviewed in [employer] record.   Physical Exam: General: Well developed, well nourished, no acute distress Head: Normocephalic and atraumatic Eyes: Sclerae anicteric, EOMI Ears: Normal auditory acuity Mouth: Not examined, mask on during Covid-19 pandemic Lungs: Clear throughout to auscultation Heart: Regular rate and rhythm; no murmurs, rubs or bruits Abdomen: Soft, non tender and non distended. No masses, hepatosplenomegaly or hernias noted. Normal Bowel sounds Rectal: Not done Musculoskeletal: Symmetrical with no gross deformities  Pulses:  Normal pulses noted Extremities: No clubbing, cyanosis, edema or deformities noted Neurological: Alert oriented x 4, grossly nonfocal Psychological:  Alert and cooperative. Normal mood and affect   Assessment and Recommendations:  IBS-D.  Add glycopyrrolate 1 mg p.o. twice daily as needed.  Continue hyoscyamine 0.125 mg 1-2 SL every 4 hours as needed.  If she has recurrence of symptoms that are not associated with stress I strongly advised that she contact us for further evaluation. Crohn's colitis.  Continue mesalamine 1.2 g daily.  Request blood work from Dr. Quillian Quince Paterson's office following her upcoming annual exam. GERD.   Continue omeprazole 20 mg daily.  Follow antireflux measures.

## 2020-10-09 NOTE — Patient Instructions (Signed)
We have sent the following medications to your pharmacy for you to pick up at your convenience: hysocamine, Lialda, robinul and omeprazole.   Please have your primary care physician fax labs after your next appointment.   Due to recent changes in healthcare laws, you may see the results of your imaging and laboratory studies on MyChart before your provider has had a chance to review them.  We understand that in some cases there may be results that are confusing or concerning to you. Not all laboratory results come back in the same time frame and the provider may be waiting for multiple results in order to interpret others.  Please give [place] 48 hours in order for your provider to thoroughly review all the results before contacting the office for clarification of your results.   The [facility] providers would like to encourage you to use [facility] to communicate with providers for non-urgent requests or questions.  Due to long hold times on the telephone, sending your provider a message by [facility] may be a faster and more efficient way to get a response.  Please allow 48 business hours for a response.  Please remember that this is for non-urgent requests.   Thank you for choosing me and [facility].  Pricilla Riffle. Dagoberto Ligas., MD., [facility] Regal

## 2020-11-27 DIAGNOSIS — Z85828 Personal history of other malignant neoplasm of skin: Secondary | ICD-10-CM | POA: Diagnosis not present

## 2020-11-27 DIAGNOSIS — L821 Other seborrheic keratosis: Secondary | ICD-10-CM | POA: Diagnosis not present

## 2020-11-27 DIAGNOSIS — Q828 Other specified congenital malformations of skin: Secondary | ICD-10-CM | POA: Diagnosis not present

## 2020-11-27 DIAGNOSIS — D225 Melanocytic nevi of trunk: Secondary | ICD-10-CM | POA: Diagnosis not present

## 2020-11-27 DIAGNOSIS — L578 Other skin changes due to chronic exposure to nonionizing radiation: Secondary | ICD-10-CM | POA: Diagnosis not present

## 2020-11-27 DIAGNOSIS — D2372 Other benign neoplasm of skin of left lower limb, including hip: Secondary | ICD-10-CM | POA: Diagnosis not present

## 2020-12-12 DIAGNOSIS — R1031 Right lower quadrant pain: Secondary | ICD-10-CM | POA: Diagnosis not present

## 2020-12-22 DIAGNOSIS — J069 Acute upper respiratory infection, unspecified: Secondary | ICD-10-CM | POA: Diagnosis not present

## 2020-12-22 DIAGNOSIS — J0100 Acute maxillary sinusitis, unspecified: Secondary | ICD-10-CM | POA: Diagnosis not present

## 2020-12-29 DIAGNOSIS — E785 Hyperlipidemia, unspecified: Secondary | ICD-10-CM | POA: Diagnosis not present

## 2020-12-29 DIAGNOSIS — I10 Essential (primary) hypertension: Secondary | ICD-10-CM | POA: Diagnosis not present

## 2020-12-29 DIAGNOSIS — R69 Illness, unspecified: Secondary | ICD-10-CM | POA: Diagnosis not present

## 2021-01-12 DIAGNOSIS — N3001 Acute cystitis with hematuria: Secondary | ICD-10-CM | POA: Diagnosis not present

## 2021-01-12 DIAGNOSIS — N1832 Chronic kidney disease, stage 3b: Secondary | ICD-10-CM | POA: Diagnosis not present

## 2021-01-12 DIAGNOSIS — R300 Dysuria: Secondary | ICD-10-CM | POA: Diagnosis not present

## 2021-01-12 DIAGNOSIS — N390 Urinary tract infection, site not specified: Secondary | ICD-10-CM | POA: Diagnosis not present

## 2021-01-14 DIAGNOSIS — R0981 Nasal congestion: Secondary | ICD-10-CM | POA: Diagnosis not present

## 2021-01-14 DIAGNOSIS — Z1152 Encounter for screening for COVID-19: Secondary | ICD-10-CM | POA: Diagnosis not present

## 2021-01-14 DIAGNOSIS — N3001 Acute cystitis with hematuria: Secondary | ICD-10-CM | POA: Diagnosis not present

## 2021-01-14 DIAGNOSIS — R051 Acute cough: Secondary | ICD-10-CM | POA: Diagnosis not present

## 2021-01-14 DIAGNOSIS — R5383 Other fatigue: Secondary | ICD-10-CM | POA: Diagnosis not present

## 2021-01-22 DIAGNOSIS — I10 Essential (primary) hypertension: Secondary | ICD-10-CM | POA: Diagnosis not present

## 2021-02-03 DIAGNOSIS — I10 Essential (primary) hypertension: Secondary | ICD-10-CM | POA: Diagnosis not present

## 2021-02-03 DIAGNOSIS — E785 Hyperlipidemia, unspecified: Secondary | ICD-10-CM | POA: Diagnosis not present

## 2021-02-12 DIAGNOSIS — Z0000 Encounter for general adult medical examination without abnormal findings: Secondary | ICD-10-CM | POA: Diagnosis not present

## 2021-02-12 DIAGNOSIS — J310 Chronic rhinitis: Secondary | ICD-10-CM | POA: Diagnosis not present

## 2021-02-12 DIAGNOSIS — I129 Hypertensive chronic kidney disease with stage 1 through stage 4 chronic kidney disease, or unspecified chronic kidney disease: Secondary | ICD-10-CM | POA: Diagnosis not present

## 2021-02-12 DIAGNOSIS — H9193 Unspecified hearing loss, bilateral: Secondary | ICD-10-CM | POA: Diagnosis not present

## 2021-02-12 DIAGNOSIS — K5010 Crohn's disease of large intestine without complications: Secondary | ICD-10-CM | POA: Diagnosis not present

## 2021-02-12 DIAGNOSIS — E785 Hyperlipidemia, unspecified: Secondary | ICD-10-CM | POA: Diagnosis not present

## 2021-02-12 DIAGNOSIS — R82998 Other abnormal findings in urine: Secondary | ICD-10-CM | POA: Diagnosis not present

## 2021-02-12 DIAGNOSIS — N1832 Chronic kidney disease, stage 3b: Secondary | ICD-10-CM | POA: Diagnosis not present

## 2021-02-12 DIAGNOSIS — Z23 Encounter for immunization: Secondary | ICD-10-CM | POA: Diagnosis not present

## 2021-03-01 IMAGING — MG DIGITAL SCREENING BILAT W/ TOMO W/ CAD
8 series · 9 of 24 positions shown · non-contrast
Comparison: Previous exam(s).

CLINICAL DATA: Screening.

EXAM:
DIGITAL SCREENING BILATERAL MAMMOGRAM WITH TOMO AND CAD

[R CC synth-2D]
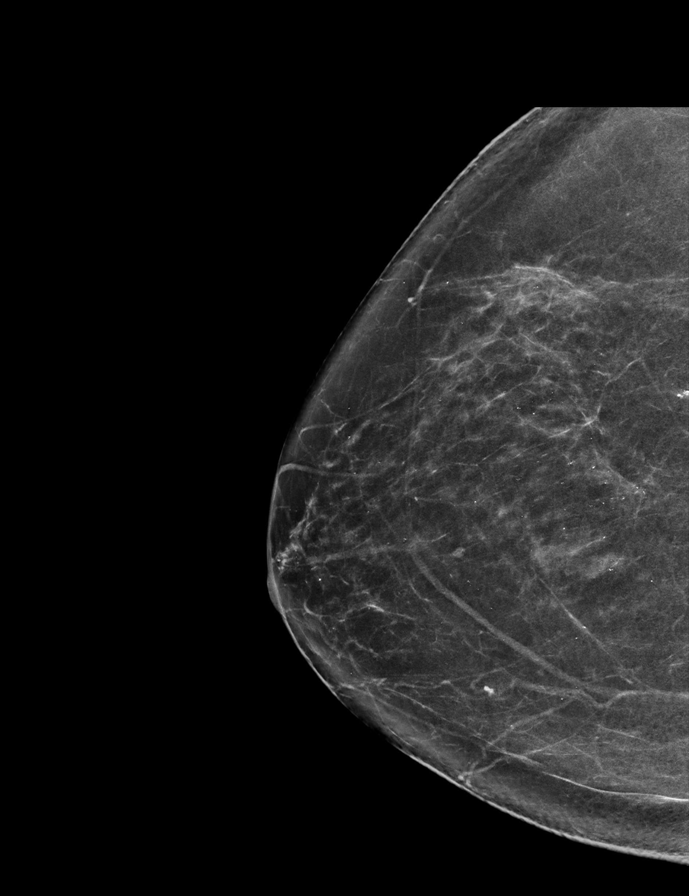

[R MLO synth-2D]
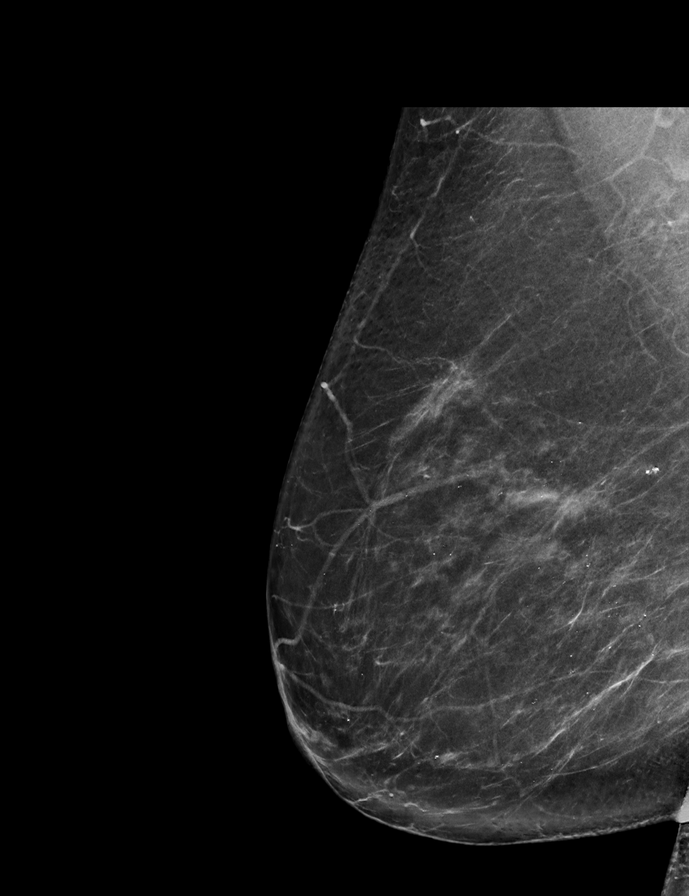

[L MLO synth-2D]
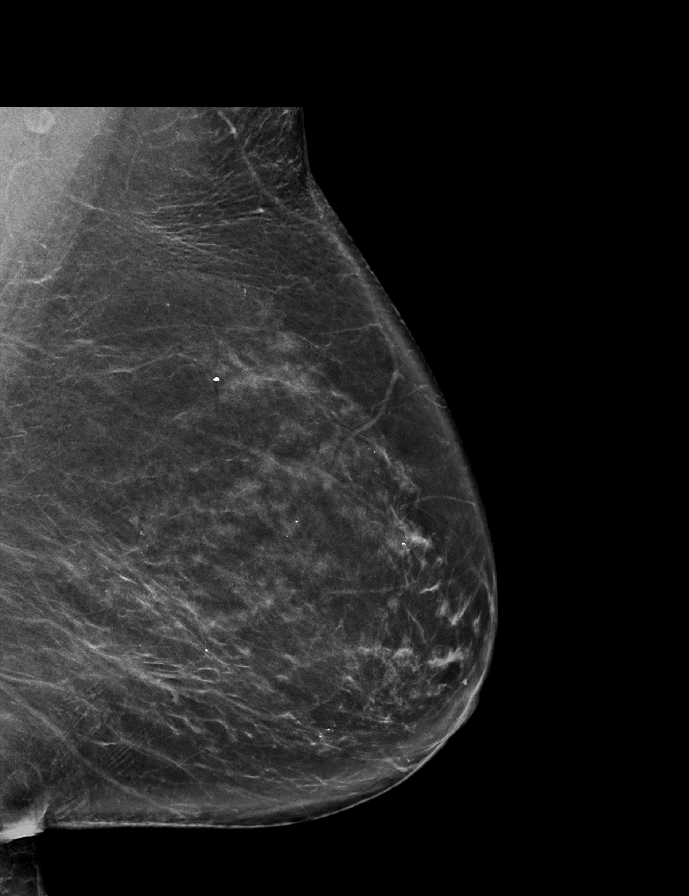

[L CC synth-2D]
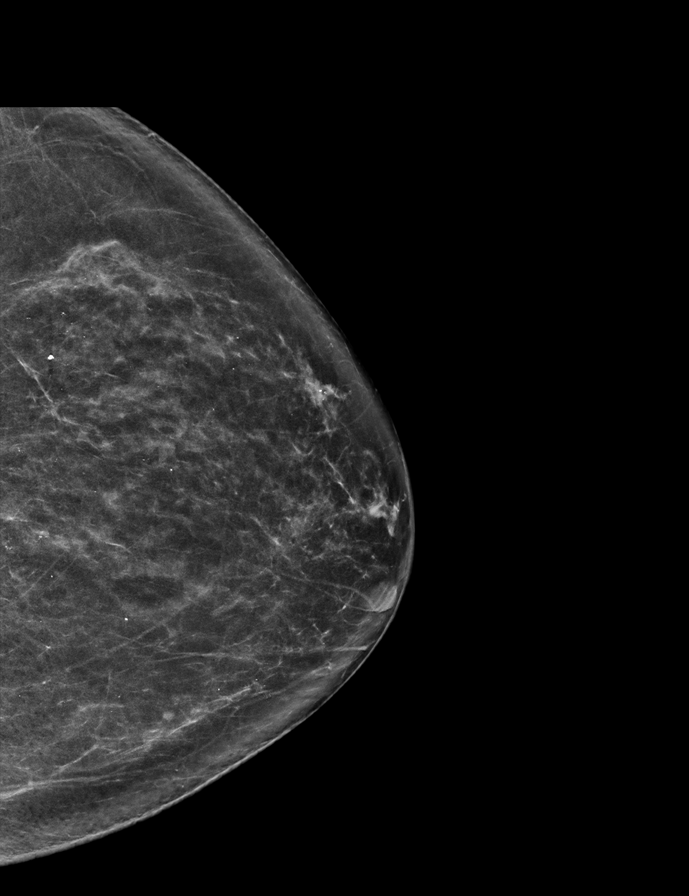

[L CC tomo · 2 of 64 frames shown]
[frame 21/64]
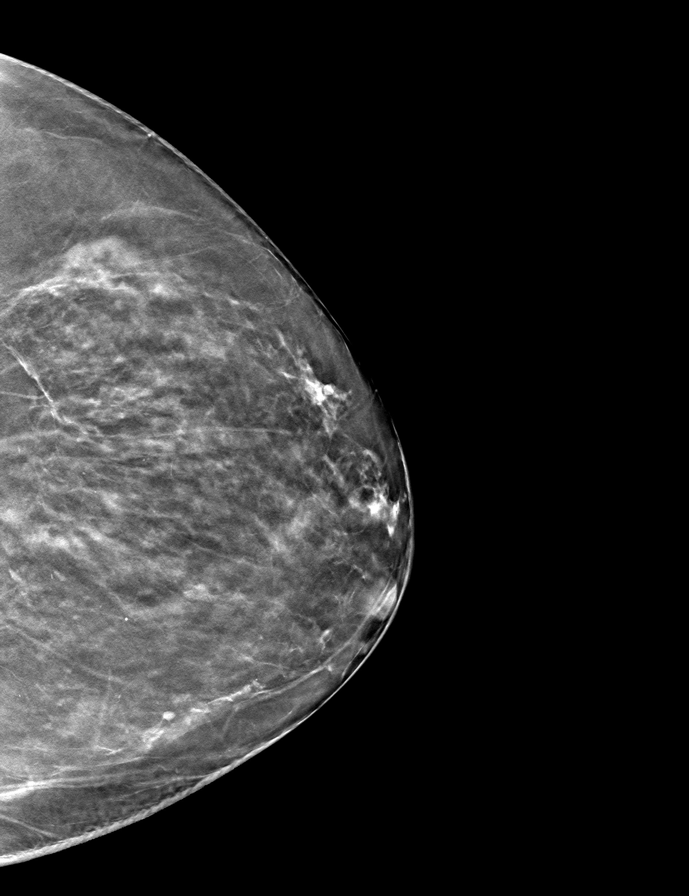
[frame 33/64]
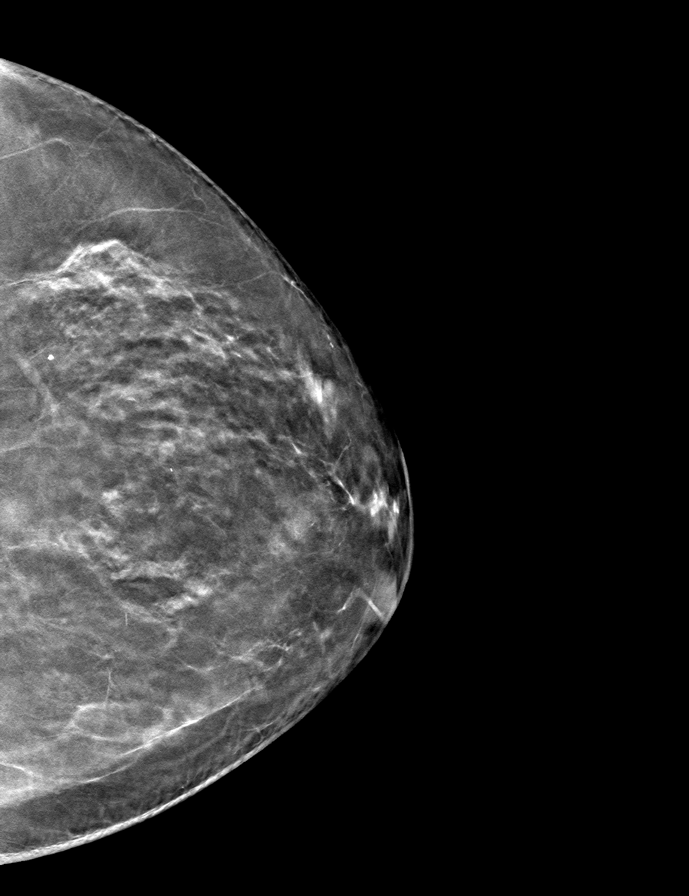

[R CC tomo · tomo slice 33/65.0]
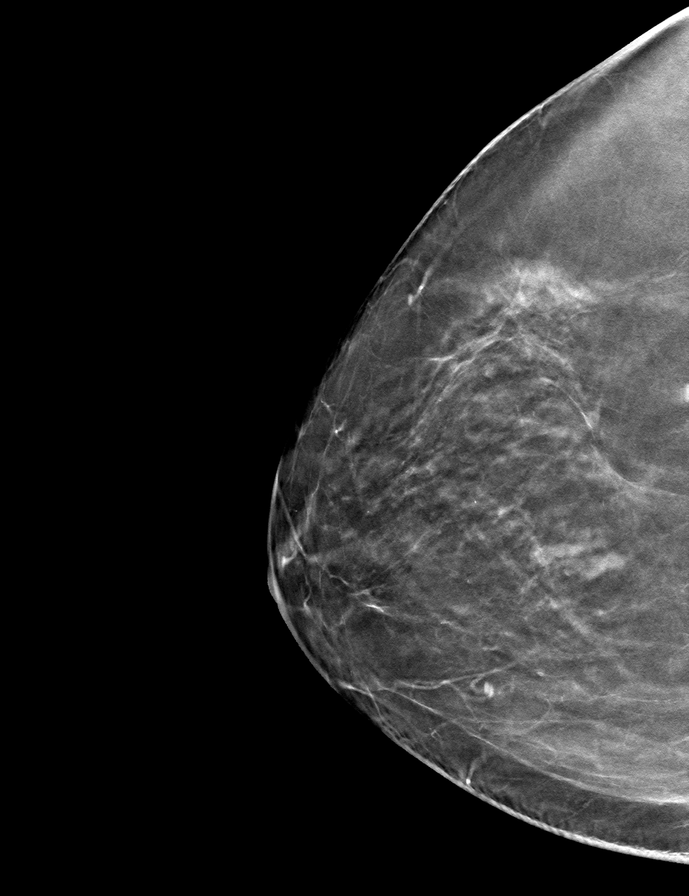

[R MLO tomo · tomo slice 37/73.0]
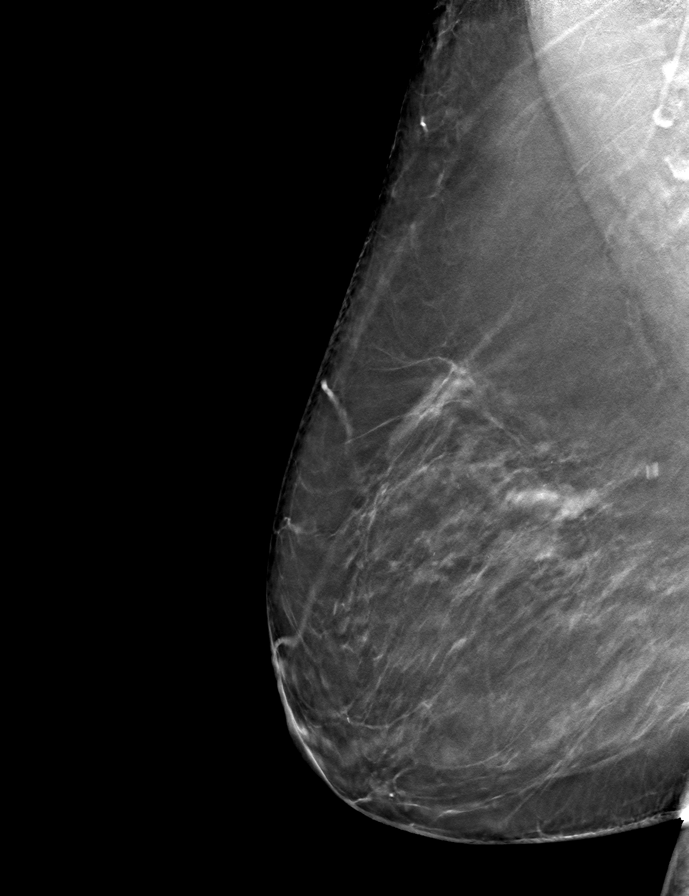

[L MLO tomo · tomo slice 39/78.0]
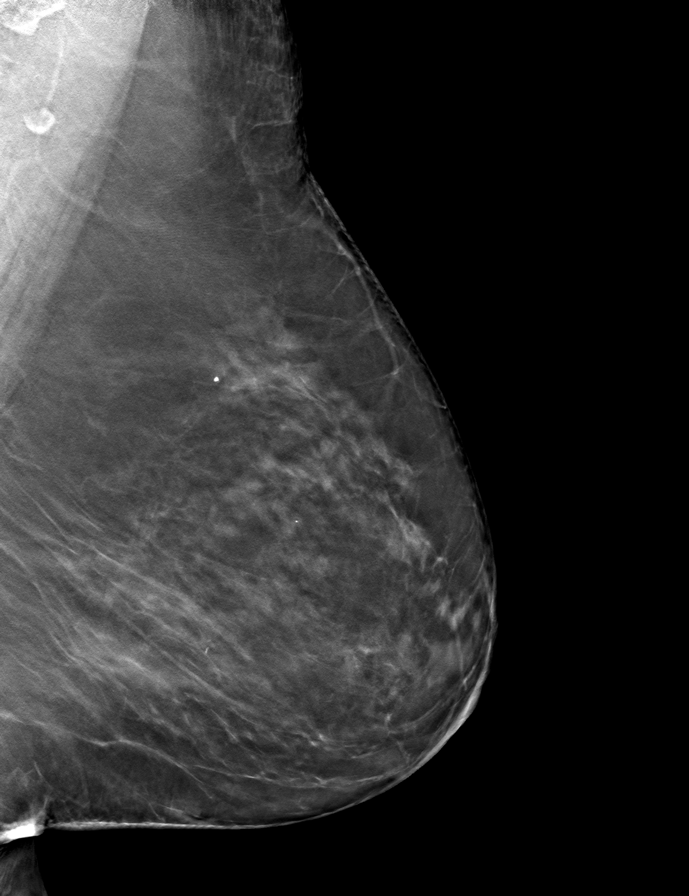

[9 of 24 positions shown; findings below may reference images not displayed]

ACR Breast Density Category b: There are scattered areas of
fibroglandular density.
FINDINGS: There are no findings suspicious for malignancy. Images were
processed with CAD.
IMPRESSION: No mammographic evidence of malignancy. A result letter of this
screening mammogram will be mailed directly to the patient.

RECOMMENDATION:
Screening mammogram in one year. (Code:[id number]-775)

BI-RADS CATEGORY  1: Negative.

## 2021-03-05 DIAGNOSIS — J329 Chronic sinusitis, unspecified: Secondary | ICD-10-CM | POA: Diagnosis not present

## 2021-03-05 DIAGNOSIS — H6503 Acute serous otitis media, bilateral: Secondary | ICD-10-CM | POA: Diagnosis not present

## 2021-03-19 DIAGNOSIS — J329 Chronic sinusitis, unspecified: Secondary | ICD-10-CM | POA: Diagnosis not present

## 2021-04-08 DIAGNOSIS — H6503 Acute serous otitis media, bilateral: Secondary | ICD-10-CM | POA: Diagnosis not present

## 2021-04-08 DIAGNOSIS — J329 Chronic sinusitis, unspecified: Secondary | ICD-10-CM | POA: Diagnosis not present

## 2021-05-18 DIAGNOSIS — E785 Hyperlipidemia, unspecified: Secondary | ICD-10-CM | POA: Diagnosis not present

## 2021-05-18 DIAGNOSIS — R69 Illness, unspecified: Secondary | ICD-10-CM | POA: Diagnosis not present

## 2021-05-18 DIAGNOSIS — K5090 Crohn's disease, unspecified, without complications: Secondary | ICD-10-CM | POA: Diagnosis not present

## 2021-05-18 DIAGNOSIS — I10 Essential (primary) hypertension: Secondary | ICD-10-CM | POA: Diagnosis not present

## 2021-05-19 ENCOUNTER — Emergency Department (HOSPITAL_BASED_OUTPATIENT_CLINIC_OR_DEPARTMENT_OTHER)
Admission: EM | Admit: 2021-05-19 | Discharge: 2021-05-19 | Disposition: A | Discharge status: Home / Self Care | Payer: Medicare HMO | Attending: Emergency Medicine | Admitting: Emergency Medicine

## 2021-05-19 ENCOUNTER — Encounter (HOSPITAL_BASED_OUTPATIENT_CLINIC_OR_DEPARTMENT_OTHER): Payer: Self-pay | Admitting: Emergency Medicine

## 2021-05-19 ENCOUNTER — Other Ambulatory Visit: Payer: Self-pay

## 2021-05-19 DIAGNOSIS — R0981 Nasal congestion: Secondary | ICD-10-CM | POA: Insufficient documentation

## 2021-05-19 DIAGNOSIS — H6503 Acute serous otitis media, bilateral: Secondary | ICD-10-CM | POA: Diagnosis not present

## 2021-05-19 DIAGNOSIS — Z79899 Other long term (current) drug therapy: Secondary | ICD-10-CM | POA: Insufficient documentation

## 2021-05-19 DIAGNOSIS — H9193 Unspecified hearing loss, bilateral: Secondary | ICD-10-CM | POA: Insufficient documentation

## 2021-05-19 DIAGNOSIS — Z7982 Long term (current) use of aspirin: Secondary | ICD-10-CM | POA: Insufficient documentation

## 2021-05-19 NOTE — ED Provider Notes (Signed)
[facility] EMERGENCY DEPT  Provider Note  CSN: [id number] Arrival date & time: 05/19/21 1511  History Chief Complaint  Patient presents with   sinus infection    Sandra Yang is a 88 y.o. female reports about 4-5 months of nasal congestion and decreased hearing. She has not had any fever or sinus pain. She has been to PCP and ENT PA several times and been on multiple rounds of Abx with minimal improvement. She has asked her ENT PA about getting 'a scan' but was told insurance wouldn't pay for it. She is here today because she is still having symptoms. She thinks a scan may help with management. She has been advised to have her hearing checked but she reports she was hearing find before September and isn't sure why that would be helpful now.    Home Medications Prior to Admission medications   Medication Sig Start Date End Date Taking? Authorizing Provider  amLODipine (NORVASC) 5 MG tablet Take 1 tablet (5 mg total) by mouth daily. 04/27/15   Nita Sells, MD  aspirin 81 MG tablet Take 81 mg by mouth daily.      [provider]  Cholecalciferol (VITAMIN D) 2000 UNITS CAPS Take 2,000 Units by mouth daily.     [provider]  fenofibrate 160 MG tablet Take 160 mg by mouth daily.      [provider]  glycopyrrolate (ROBINUL) 1 MG tablet Take 1 tablet (1 mg total) by mouth 2 (two) times daily as needed. 10/09/20   Ladene Artist, MD  hyoscyamine (LEVSIN SL) 0.125 MG SL tablet PLACE 1-2 TABLET UNDER THE TONGUE EVERY 4 HOURS AS NEEDED FOR ABDOMINAL PAIN 10/09/20   Ladene Artist, MD  KRILL OIL PO Take 1 tablet by mouth daily.    [provider]  mesalamine (LIALDA) 1.2 g EC tablet Take 1 tablet (1.2 g total) by mouth daily with breakfast. 10/09/20   Ladene Artist, MD  metoprolol succinate (TOPROL-XL) 25 MG 24 hr tablet Take 25 mg by mouth daily.    [provider]  omeprazole (PRILOSEC) 20 MG capsule Take 1 capsule (20 mg  total) by mouth daily. 10/09/20   Ladene Artist, MD  triamterene-hydrochlorothiazide (MAXZIDE-25) 37.5-25 MG per tablet Take 1 tablet by mouth daily.  11/09/11   [provider]     Allergies    Erythromycin, Tetanus toxoids, and Tape   Review of Systems   Review of Systems Please see HPI for pertinent positives and negatives  Physical Exam BP (!) 170/67 (BP Location: Right Arm)    Pulse 82    Temp 98 F (36.7 C)    Resp 16    Ht 5' (1.524 m)    Wt 52.6 kg    SpO2 97%    BMI 22.65 kg/m   Physical Exam Vitals and nursing note reviewed.  HENT:     Head: Normocephalic.     Right Ear: Ear canal normal.     Left Ear: Ear canal normal.     Ears:     Comments: Mild serous otitis, no signs of infection    Nose: Nose normal. No congestion or rhinorrhea.     Mouth/Throat:     Mouth: Mucous membranes are moist.     Pharynx: No oropharyngeal exudate or posterior oropharyngeal erythema.  Eyes:     Extraocular Movements: Extraocular movements intact.  Pulmonary:     Effort: Pulmonary effort is normal.  Musculoskeletal:  General: Normal range of motion.     Cervical back: Neck supple.  Skin:    Findings: No rash (on exposed skin).  Neurological:     Mental Status: She is alert and oriented to person, place, and time.  Psychiatric:        Mood and Affect: Mood normal.    ED Results / Procedures / Treatments   EKG None  Procedures Procedures  Medications Ordered in the ED Medications - No data to display  Initial Impression and Plan  Patient here with reports of recurrent sinusitis but she has not had any sinus symptoms other than nasal congestion. She is not having fever, sinus pressure/pain. She has had hearing loss attributed to serous otitis but continues despite antibiotic treatment. I had a long discussion with her regarding her chronic symptoms. I discussed with her that a scan would not be helpful in her ED management. I encouraged her to see ENT as  scheduled in 10 days for audiology testing and explained that there are different causes of hearing loss that need to be investigated. She does not have any current signs of ear, sinus or nasal cavity infection. No other ED workup is indicated.   ED Course       MDM Rules/Calculators/A&P Medical Decision Making Problems Addressed: Bilateral hearing loss, unspecified hearing loss type: chronic illness or injury    Final Clinical Impression(s) / ED Diagnoses Final diagnoses:  Bilateral hearing loss, unspecified hearing loss type    Rx / DC Orders ED Discharge Orders     None        Truddie Hidden, MD 05/19/21 1913

## 2021-05-19 NOTE — ED Triage Notes (Signed)
Pt reports sinus infection and trouble hearing since September. States she has seen ENT twice and her PCP twice. Reports she has been on multiple abx and using nasal spray. Pt requesting CT scan of sinuses. Denies any pain.

## 2021-05-22 DIAGNOSIS — N1832 Chronic kidney disease, stage 3b: Secondary | ICD-10-CM | POA: Diagnosis not present

## 2021-05-22 DIAGNOSIS — I129 Hypertensive chronic kidney disease with stage 1 through stage 4 chronic kidney disease, or unspecified chronic kidney disease: Secondary | ICD-10-CM | POA: Diagnosis not present

## 2021-05-29 DIAGNOSIS — H903 Sensorineural hearing loss, bilateral: Secondary | ICD-10-CM | POA: Diagnosis not present

## 2021-05-29 DIAGNOSIS — H6503 Acute serous otitis media, bilateral: Secondary | ICD-10-CM | POA: Diagnosis not present

## 2021-05-29 DIAGNOSIS — Z87891 Personal history of nicotine dependence: Secondary | ICD-10-CM | POA: Diagnosis not present

## 2021-06-18 DIAGNOSIS — N1832 Chronic kidney disease, stage 3b: Secondary | ICD-10-CM | POA: Diagnosis not present

## 2021-06-18 DIAGNOSIS — I129 Hypertensive chronic kidney disease with stage 1 through stage 4 chronic kidney disease, or unspecified chronic kidney disease: Secondary | ICD-10-CM | POA: Diagnosis not present

## 2021-06-18 DIAGNOSIS — Z961 Presence of intraocular lens: Secondary | ICD-10-CM | POA: Diagnosis not present

## 2021-06-18 DIAGNOSIS — H52203 Unspecified astigmatism, bilateral: Secondary | ICD-10-CM | POA: Diagnosis not present

## 2021-06-18 DIAGNOSIS — H04123 Dry eye syndrome of bilateral lacrimal glands: Secondary | ICD-10-CM | POA: Diagnosis not present

## 2021-06-30 DIAGNOSIS — J329 Chronic sinusitis, unspecified: Secondary | ICD-10-CM | POA: Diagnosis not present

## 2021-06-30 DIAGNOSIS — H6503 Acute serous otitis media, bilateral: Secondary | ICD-10-CM | POA: Diagnosis not present

## 2021-08-04 DIAGNOSIS — H6983 Other specified disorders of Eustachian tube, bilateral: Secondary | ICD-10-CM | POA: Diagnosis not present

## 2021-08-04 DIAGNOSIS — R040 Epistaxis: Secondary | ICD-10-CM | POA: Diagnosis not present

## 2021-08-04 DIAGNOSIS — H6503 Acute serous otitis media, bilateral: Secondary | ICD-10-CM | POA: Diagnosis not present

## 2021-08-04 DIAGNOSIS — J3489 Other specified disorders of nose and nasal sinuses: Secondary | ICD-10-CM | POA: Diagnosis not present

## 2021-08-04 DIAGNOSIS — J329 Chronic sinusitis, unspecified: Secondary | ICD-10-CM | POA: Diagnosis not present

## 2021-08-13 DIAGNOSIS — K5010 Crohn's disease of large intestine without complications: Secondary | ICD-10-CM | POA: Diagnosis not present

## 2021-08-13 DIAGNOSIS — I129 Hypertensive chronic kidney disease with stage 1 through stage 4 chronic kidney disease, or unspecified chronic kidney disease: Secondary | ICD-10-CM | POA: Diagnosis not present

## 2021-08-13 DIAGNOSIS — H9193 Unspecified hearing loss, bilateral: Secondary | ICD-10-CM | POA: Diagnosis not present

## 2021-10-01 DIAGNOSIS — N390 Urinary tract infection, site not specified: Secondary | ICD-10-CM | POA: Diagnosis not present

## 2021-10-29 ENCOUNTER — Other Ambulatory Visit: Payer: Self-pay | Admitting: Gastroenterology

## 2021-11-10 DIAGNOSIS — H6503 Acute serous otitis media, bilateral: Secondary | ICD-10-CM | POA: Diagnosis not present

## 2021-11-10 DIAGNOSIS — J329 Chronic sinusitis, unspecified: Secondary | ICD-10-CM | POA: Diagnosis not present

## 2021-11-10 DIAGNOSIS — J339 Nasal polyp, unspecified: Secondary | ICD-10-CM | POA: Diagnosis not present

## 2021-11-10 DIAGNOSIS — J310 Chronic rhinitis: Secondary | ICD-10-CM | POA: Diagnosis not present

## 2021-11-23 ENCOUNTER — Other Ambulatory Visit: Payer: Self-pay | Admitting: Internal Medicine

## 2021-11-23 DIAGNOSIS — Z1231 Encounter for screening mammogram for malignant neoplasm of breast: Secondary | ICD-10-CM

## 2021-11-26 ENCOUNTER — Encounter: Payer: Self-pay | Admitting: Gastroenterology

## 2021-12-03 DIAGNOSIS — L578 Other skin changes due to chronic exposure to nonionizing radiation: Secondary | ICD-10-CM | POA: Diagnosis not present

## 2021-12-03 DIAGNOSIS — Z85828 Personal history of other malignant neoplasm of skin: Secondary | ICD-10-CM | POA: Diagnosis not present

## 2021-12-03 DIAGNOSIS — Q828 Other specified congenital malformations of skin: Secondary | ICD-10-CM | POA: Diagnosis not present

## 2021-12-03 DIAGNOSIS — L309 Dermatitis, unspecified: Secondary | ICD-10-CM | POA: Diagnosis not present

## 2021-12-03 DIAGNOSIS — D2372 Other benign neoplasm of skin of left lower limb, including hip: Secondary | ICD-10-CM | POA: Diagnosis not present

## 2021-12-03 DIAGNOSIS — D225 Melanocytic nevi of trunk: Secondary | ICD-10-CM | POA: Diagnosis not present

## 2021-12-03 DIAGNOSIS — L820 Inflamed seborrheic keratosis: Secondary | ICD-10-CM | POA: Diagnosis not present

## 2021-12-03 DIAGNOSIS — D485 Neoplasm of uncertain behavior of skin: Secondary | ICD-10-CM | POA: Diagnosis not present

## 2021-12-10 ENCOUNTER — Ambulatory Visit
Admission: RE | Admit: 2021-12-10 | Discharge: 2021-12-10 | Disposition: A | Discharge status: Home / Self Care | Payer: Medicare HMO | Source: Ambulatory Visit | Attending: Internal Medicine | Admitting: Internal Medicine

## 2021-12-10 DIAGNOSIS — Z1231 Encounter for screening mammogram for malignant neoplasm of breast: Secondary | ICD-10-CM

## 2022-01-04 ENCOUNTER — Other Ambulatory Visit: Payer: Self-pay | Admitting: Gastroenterology

## 2022-01-13 DIAGNOSIS — H9211 Otorrhea, right ear: Secondary | ICD-10-CM | POA: Diagnosis not present

## 2022-01-13 DIAGNOSIS — J339 Nasal polyp, unspecified: Secondary | ICD-10-CM | POA: Diagnosis not present

## 2022-01-13 DIAGNOSIS — H6503 Acute serous otitis media, bilateral: Secondary | ICD-10-CM | POA: Diagnosis not present

## 2022-01-26 ENCOUNTER — Other Ambulatory Visit: Payer: Self-pay | Admitting: Gastroenterology

## 2022-02-02 ENCOUNTER — Encounter: Payer: Self-pay | Admitting: Gastroenterology

## 2022-02-02 ENCOUNTER — Ambulatory Visit: Payer: Medicare HMO | Admitting: Gastroenterology

## 2022-02-02 VITALS — BP 120/60 | HR 52 | Ht 60.0 in | Wt 122.0 lb

## 2022-02-02 DIAGNOSIS — K5010 Crohn's disease of large intestine without complications: Secondary | ICD-10-CM | POA: Diagnosis not present

## 2022-02-02 DIAGNOSIS — K219 Gastro-esophageal reflux disease without esophagitis: Secondary | ICD-10-CM

## 2022-02-02 MED ORDER — OMEPRAZOLE 20 MG PO CPDR: 20.0000 mg | DELAYED_RELEASE_CAPSULE | Freq: Every day | ORAL | 3 refills | Status: DC

## 2022-02-02 MED ORDER — MESALAMINE 1.2 G PO TBEC: 1.2000 g | DELAYED_RELEASE_TABLET | Freq: Every day | ORAL | 3 refills | Status: DC

## 2022-02-02 MED ORDER — HYOSCYAMINE SULFATE 0.125 MG SL SUBL: SUBLINGUAL_TABLET | SUBLINGUAL | 3 refills | Status: DC

## 2022-02-02 MED ORDER — GLYCOPYRROLATE 1 MG PO TABS: 1.0000 mg | ORAL_TABLET | Freq: Two times a day (BID) | ORAL | 3 refills | Status: DC | PRN

## 2022-02-02 NOTE — Progress Notes (Signed)
Assessment     Crohn's colitis IBS GERD   Recommendations    Continue Lialda 1.2 g daily Continue glycopyrrolate 1 mg twice daily as needed, hyoscyamine 0.125 mg 1-2 SL every 4 hours as needed Continue omeprazole 20 mg daily and follow antireflux measures REV in 1 year   HPI    This is an 88 year old female with Crohn's colitis, IBS and GERD.  She states all her GI symptoms are well controlled she notes infrequent days with mild morning diarrhea that she relates to certain foods.  No other gastrointestinal complaints.  She continues to work part-time with [employer] and overall feels well.   Labs / Imaging       Latest Ref Rng & Units 04/06/2017    3:25 PM 04/20/2011   10:58 AM  Hepatic Function  Total Protein 6.0 - 8.3 g/dL 6.9  8.5   Albumin 3.5 - 5.2 g/dL 4.2  4.6   AST 0 - 37 U/L 19  34   ALT 0 - 35 U/L 19  38   Alk Phosphatase 39 - 117 U/L 46  58   Total Bilirubin 0.2 - 1.2 mg/dL 0.5  1.3        Latest Ref Rng & Units 04/06/2017    3:25 PM 04/26/2015    1:04 AM 04/20/2011   10:58 AM  CBC  WBC 4.0 - 10.5 K/uL 4.9  4.8  4.2   Hemoglobin 12.0 - 15.0 g/dL 13.1  14.6  17.3   Hematocrit 36.0 - 46.0 % 39.0  42.9  49.2   Platelets 150.0 - 400.0 K/uL 193.0  191  229.0     Current Medications, Allergies, Past Medical History, Past Surgical History, Family History and Social History were reviewed in [facility] electronic medical record.   Physical Exam: General: Well developed, well nourished, no acute distress Head: Normocephalic and atraumatic Eyes: Sclerae anicteric, EOMI Ears: Normal auditory acuity Mouth: Not examined Lungs: Clear throughout to auscultation Heart: Regular rate and rhythm; no murmurs, rubs or bruits Abdomen: Soft, non tender and non distended. No masses, hepatosplenomegaly or hernias noted. Normal Bowel sounds Rectal: Not done Musculoskeletal: Symmetrical with no gross deformities  Pulses:  Normal pulses noted Extremities: No  clubbing, cyanosis, edema or deformities noted Neurological: Alert oriented x 4, grossly nonfocal Psychological:  Alert and cooperative. Normal mood and affect    T. , MD 02/02/2022, 8:59 AM  

## 2022-02-02 NOTE — Patient Instructions (Addendum)
We have sent the following medications to your pharmacy for you to pick up at your convenience: glycopyrrolate, hyoscyamine, mesalamine and omeprazole.   The [facility] providers would like to encourage you to use [facility] to communicate with providers for non-urgent requests or questions.  Due to long hold times on the telephone, sending your provider a message by [facility] may be a faster and more efficient way to get a response.  Please allow 48 business hours for a response.  Please remember that this is for non-urgent requests.   Thank you for choosing me and [facility].  Pricilla Riffle. Dagoberto Ligas., MD., [facility] Regal

## 2022-02-09 DIAGNOSIS — H9211 Otorrhea, right ear: Secondary | ICD-10-CM | POA: Diagnosis not present

## 2022-03-03 DIAGNOSIS — H6503 Acute serous otitis media, bilateral: Secondary | ICD-10-CM | POA: Diagnosis not present

## 2022-03-03 DIAGNOSIS — H6983 Other specified disorders of Eustachian tube, bilateral: Secondary | ICD-10-CM | POA: Diagnosis not present

## 2022-03-03 DIAGNOSIS — H906 Mixed conductive and sensorineural hearing loss, bilateral: Secondary | ICD-10-CM | POA: Diagnosis not present

## 2022-03-22 ENCOUNTER — Emergency Department (HOSPITAL_COMMUNITY)
Admission: EM | Admit: 2022-03-22 | Discharge: 2022-03-23 | Discharge status: Against Medical Advice | Payer: Medicare HMO | Attending: Emergency Medicine | Admitting: Emergency Medicine

## 2022-03-22 ENCOUNTER — Other Ambulatory Visit: Payer: Self-pay

## 2022-03-22 DIAGNOSIS — Z5321 Procedure and treatment not carried out due to patient leaving prior to being seen by health care provider: Secondary | ICD-10-CM | POA: Insufficient documentation

## 2022-03-22 DIAGNOSIS — I10 Essential (primary) hypertension: Secondary | ICD-10-CM | POA: Diagnosis not present

## 2022-03-22 DIAGNOSIS — R0602 Shortness of breath: Secondary | ICD-10-CM | POA: Insufficient documentation

## 2022-03-22 DIAGNOSIS — Z7982 Long term (current) use of aspirin: Secondary | ICD-10-CM | POA: Insufficient documentation

## 2022-03-22 DIAGNOSIS — R0789 Other chest pain: Secondary | ICD-10-CM | POA: Diagnosis not present

## 2022-03-22 DIAGNOSIS — Z79899 Other long term (current) drug therapy: Secondary | ICD-10-CM | POA: Insufficient documentation

## 2022-03-22 DIAGNOSIS — R079 Chest pain, unspecified: Secondary | ICD-10-CM | POA: Diagnosis not present

## 2022-03-22 NOTE — ED Triage Notes (Signed)
Patient reports intermittent chest pressure onset last Friday , no SOB .

## 2022-03-22 NOTE — ED Provider Triage Note (Signed)
  Emergency Medicine Provider Triage Evaluation Note  MRN:  [id number]  Arrival date & time: 03/22/22    Medically screening exam initiated at 11:47 PM.   CC:   Chest Pressure   HPI:  Sandra Yang is a 88 y.o. year-old female presents to the ED with chief complaint of chest pressure and belching for the past 4 days.  She denies fever, chills, or cough.  She reports slight SOB.  History provided by patient. ROS:  -As included in HPI PE:   Vitals:   03/22/22 2346  BP: (!) 197/80  Pulse: 74  Resp: 16  Temp: 97.7 F (36.5 C)  SpO2: 99%    Non-toxic appearing No respiratory distress  MDM:  Based on signs and symptoms, GERD is highest on my differential, followed by ACS. I've ordered labs and imaging in triage to expedite lab/diagnostic workup.  Patient was informed that the remainder of the evaluation will be completed by another provider, this initial triage assessment does not replace that evaluation, and the importance of remaining in the ED until their evaluation is complete.    Montine Circle, PA-C 03/22/22 2348

## 2022-03-23 ENCOUNTER — Emergency Department (HOSPITAL_BASED_OUTPATIENT_CLINIC_OR_DEPARTMENT_OTHER)
Admission: EM | Admit: 2022-03-23 | Discharge: 2022-03-23 | Disposition: A | Discharge status: Home / Self Care | Payer: Medicare HMO | Source: Home / Self Care | Attending: Emergency Medicine | Admitting: Emergency Medicine

## 2022-03-23 ENCOUNTER — Encounter (HOSPITAL_BASED_OUTPATIENT_CLINIC_OR_DEPARTMENT_OTHER): Payer: Self-pay | Admitting: Emergency Medicine

## 2022-03-23 ENCOUNTER — Emergency Department (HOSPITAL_COMMUNITY): Payer: Medicare HMO

## 2022-03-23 DIAGNOSIS — I10 Essential (primary) hypertension: Secondary | ICD-10-CM | POA: Insufficient documentation

## 2022-03-23 DIAGNOSIS — Z79899 Other long term (current) drug therapy: Secondary | ICD-10-CM | POA: Insufficient documentation

## 2022-03-23 DIAGNOSIS — R0789 Other chest pain: Secondary | ICD-10-CM | POA: Insufficient documentation

## 2022-03-23 DIAGNOSIS — Z7982 Long term (current) use of aspirin: Secondary | ICD-10-CM | POA: Insufficient documentation

## 2022-03-23 DIAGNOSIS — R079 Chest pain, unspecified: Secondary | ICD-10-CM | POA: Diagnosis not present

## 2022-03-23 LAB — BASIC METABOLIC PANEL
Anion gap: 6 (ref 5–15)
BUN: 23 mg/dL (ref 8–23)
CO2: 25 mmol/L (ref 22–32)
Calcium: 9.5 mg/dL (ref 8.9–10.3)
Chloride: 108 mmol/L (ref 98–111)
Creatinine, Ser: 1.24 mg/dL — ABNORMAL HIGH (ref 0.44–1.00)
GFR, Estimated: 42 mL/min — ABNORMAL LOW (ref >60)
Glucose, Bld: 97 mg/dL (ref 70–99)
Potassium: 3.8 mmol/L (ref 3.5–5.1)
Sodium: 139 mmol/L (ref 135–145)

## 2022-03-23 LAB — COMPREHENSIVE METABOLIC PANEL
ALT: 11 U/L (ref 0–44)
AST: 19 U/L (ref 15–41)
Albumin: 3.9 g/dL (ref 3.5–5.0)
Alkaline Phosphatase: 42 U/L (ref 38–126)
Anion gap: 8 (ref 5–15)
BUN: 20 mg/dL (ref 8–23)
CO2: 26 mmol/L (ref 22–32)
Calcium: 9.7 mg/dL (ref 8.9–10.3)
Chloride: 107 mmol/L (ref 98–111)
Creatinine, Ser: 0.96 mg/dL (ref 0.44–1.00)
GFR, Estimated: 58 mL/min — ABNORMAL LOW (ref >60)
Glucose, Bld: 88 mg/dL (ref 70–99)
Potassium: 3.9 mmol/L (ref 3.5–5.1)
Sodium: 141 mmol/L (ref 135–145)
Total Bilirubin: 0.6 mg/dL (ref 0.3–1.2)
Total Protein: 6.3 g/dL — ABNORMAL LOW (ref 6.5–8.1)

## 2022-03-23 LAB — TROPONIN I (HIGH SENSITIVITY)
Troponin I (High Sensitivity): 6 ng/L (ref <18)
Troponin I (High Sensitivity): 6 ng/L (ref <18)
Troponin I (High Sensitivity): 5 ng/L (ref <18)

## 2022-03-23 LAB — CBC
HCT: 38.6 % (ref 36.0–46.0)
Hemoglobin: 13.2 g/dL (ref 12.0–15.0)
MCH: 32 pg (ref 26.0–34.0)
MCHC: 34.2 g/dL (ref 30.0–36.0)
MCV: 93.7 fL (ref 80.0–100.0)
Platelets: 177 10*3/uL (ref 150–400)
RBC: 4.12 MIL/uL (ref 3.87–5.11)
RDW: 12.7 % (ref 11.5–15.5)
WBC: 4.1 10*3/uL (ref 4.0–10.5)
nRBC: 0 % (ref 0.0–0.2)

## 2022-03-23 LAB — LIPASE, BLOOD: Lipase: 21 U/L (ref 11–51)

## 2022-03-23 MED ORDER — LIDOCAINE VISCOUS HCL 2 % MT SOLN
15.0000 mL | Freq: Once | OROMUCOSAL | Status: AC
  Administered 2022-03-23: 15 mL via ORAL
  Filled 2022-03-23: qty 15

## 2022-03-23 MED ORDER — ALUM & MAG HYDROXIDE-SIMETH 200-200-20 MG/5ML PO SUSP
30.0000 mL | Freq: Once | ORAL | Status: AC
  Administered 2022-03-23: 30 mL via ORAL
  Filled 2022-03-23: qty 30

## 2022-03-23 NOTE — ED Triage Notes (Signed)
Pt arrives to ED with c/o constant chest tightness that started x4 days ago. Had lab worked at [facility] ED last night but LWBS. The pain is located central/epigastric. Associated with nausea, burping. She notes taking Pepcid this morning w/ mild relief.

## 2022-03-23 NOTE — ED Notes (Signed)
Patient's daughter explained that they were going to follow up with PCP tomorrow and stated they were going to leave.

## 2022-03-23 NOTE — ED Provider Notes (Signed)
[place] EMERGENCY DEPT Provider Note   CSN: [id number] Arrival date & time: 03/23/22  1021     History  Chief Complaint  Patient presents with   Chest Pain    Sandra Yang is a 88 y.o. female.  Patient with history of GERD, Crohn's disease, hypertension --presents to the emergency department for tightness in the chest starting 4 days ago.  This has been intermittent.  She has had frequent belching, which seems to help her symptoms.  She has had some shortness of breath with the pressure.  No lower extremity swelling.  She is tender in the left upper quadrant and epigastrium.  No fever, vomiting, diarrhea.  Has been taking omeprazole, pepcid.  She was seen at [facility] last evening and left due to wait times.  Labs at that visit were reassuring.  Upper abdominal labs were not ordered.      Home Medications Prior to Admission medications   Medication Sig Start Date End Date Taking? Authorizing Provider  amLODipine (NORVASC) 5 MG tablet Take 1 tablet (5 mg total) by mouth daily. 04/27/15   Nita Sells, MD  aspirin 81 MG tablet Take 81 mg by mouth daily.      [provider]  Cholecalciferol (VITAMIN D) 2000 UNITS CAPS Take 2,000 Units by mouth daily.     [provider]  fenofibrate 160 MG tablet Take 160 mg by mouth daily.      [provider]  glycopyrrolate (ROBINUL) 1 MG tablet Take 1 tablet (1 mg total) by mouth 2 (two) times daily as needed. 02/02/22   Ladene Artist, MD  hyoscyamine (LEVSIN SL) 0.125 MG SL tablet PLACE 1-2 TABLET UNDER THE TONGUE EVERY 4 HOURS AS NEEDED FOR ABDOMINAL PAIN 02/02/22   Ladene Artist, MD  KRILL OIL PO Take 1 tablet by mouth daily.    [provider]  mesalamine (LIALDA) 1.2 g EC tablet Take 1 tablet (1.2 g total) by mouth daily with breakfast. 02/02/22   Ladene Artist, MD  metoprolol succinate (TOPROL-XL) 25 MG 24 hr tablet Take 25 mg by mouth daily.    [provider]  omeprazole (PRILOSEC) 20 MG capsule Take 1 capsule (20 mg total) by mouth daily. 02/02/22   Ladene Artist, MD  triamterene-hydrochlorothiazide (MAXZIDE-25) 37.5-25 MG per tablet Take 1 tablet by mouth daily.  11/09/11   [provider]      Allergies    Atorvastatin, Erythromycin, Tetanus toxoids, and Tape    Review of Systems   Review of Systems  Physical Exam Updated Vital Signs BP (!) 189/59   Pulse (!) 56   Temp 97.6 F (36.4 C) (Temporal)   Resp (!) 21   Ht 5' (1.524 m)   Wt 53.5 kg   SpO2 100%   BMI 23.05 kg/m   Physical Exam Vitals and nursing note reviewed.  Constitutional:      Appearance: She is well-developed. She is not diaphoretic.  HENT:     Head: Normocephalic and atraumatic.     Mouth/Throat:     Mouth: Mucous membranes are not dry.  Eyes:     Conjunctiva/sclera: Conjunctivae normal.  Neck:     Vascular: Normal carotid pulses. No JVD.     Trachea: Trachea normal. No tracheal deviation.  Cardiovascular:     Rate and Rhythm: Normal rate and regular rhythm.     Pulses: No decreased pulses.          Radial pulses are  2+ on the right side and 2+ on the left side.     Heart sounds: Normal heart sounds, S1 normal and S2 normal. No murmur heard. Pulmonary:     Effort: Pulmonary effort is normal. No respiratory distress.     Breath sounds: Normal breath sounds. No wheezing.  Chest:     Chest wall: No tenderness.  Abdominal:     General: Bowel sounds are normal.     Palpations: Abdomen is soft.     Tenderness: There is no abdominal tenderness. There is no guarding or rebound.  Musculoskeletal:        General: Normal range of motion.     Cervical back: Normal range of motion and neck supple. No muscular tenderness.  Skin:    General: Skin is warm and dry.     Coloration: Skin is not pale.  Neurological:     Mental Status: She is alert.     ED Results / Procedures / Treatments   Labs (all labs ordered are listed, but only abnormal  results are displayed) Labs Reviewed  COMPREHENSIVE METABOLIC PANEL - Abnormal; Notable for the following components:      Result Value   Total Protein 6.3 (*)    GFR, Estimated 58 (*)    All other components within normal limits  LIPASE, BLOOD  TROPONIN I (HIGH SENSITIVITY)    ED ECG REPORT   Date: 03/23/2022  Rate: 52  Rhythm: sinus bradycardia  QRS Axis: right  Intervals: normal  ST/T Wave abnormalities: normal  Conduction Disutrbances:none  Narrative Interpretation:   Old EKG Reviewed: unchanged  I have personally reviewed the EKG tracing and agree with the computerized printout as noted.   Radiology DG Chest 2 View  Result Date: 03/23/2022 CLINICAL DATA:  Chest pain EXAM: CHEST - 2 VIEW COMPARISON:  None Available. FINDINGS: The heart size and mediastinal contours are within normal limits. Both lungs are clear. The visualized skeletal structures are unremarkable. IMPRESSION: No active cardiopulmonary disease. Electronically Signed   By: Ronney Asters M.D.   On: 03/23/2022 00:17    Procedures Procedures    Medications Ordered in ED Medications  alum & mag hydroxide-simeth (MAALOX/MYLANTA) 200-200-20 MG/5ML suspension 30 mL (30 mLs Oral Given 03/23/22 1357)    And  lidocaine (XYLOCAINE) 2 % viscous mouth solution 15 mL (15 mLs Oral Given 03/23/22 1357)    ED Course/ Medical Decision Making/ A&P    Patient seen and examined. History obtained directly from patient.  Reviewed previous workup and imaging performed last evening at [facility].  Labs/EKG: Ordered CMP, lipase, troponin.  Imaging: None ordered  Medications/Fluids: Ordered: P.o. Maalox.   Most recent vital signs reviewed and are as follows: BP (!) 189/59   Pulse (!) 56   Temp 97.6 F (36.4 C) (Temporal)   Resp (!) 21   Ht 5' (1.524 m)   Wt 53.5 kg   SpO2 100%   BMI 23.05 kg/m   Initial impression: Chest pain and upper abdominal pain, atypical for cardiac etiology, possibly upper GI.  2:50  PM Reassessment performed. Patient appears stable.  She states that the GI cocktail did help a bit with her pressure, however not fully resolved.  Patient discussed with and seen at bedside with Dr. Jeanell Sparrow.  Labs personally reviewed and interpreted including: CMP unremarkable; lipase normal; repeat troponin 5 normal.  Reviewed pertinent lab work and imaging with patient and family member at bedside. Questions answered.   Most current vital signs reviewed and are  as follows: BP (!) 182/65   Pulse (!) 55   Temp 97.9 F (36.6 C) (Oral)   Resp 18   Ht 5' (1.524 m)   Wt 53.5 kg   SpO2 100%   BMI 23.05 kg/m   Plan: Discharge to home.   Prescriptions written for: None written  Other home care instructions discussed: Criss Rosales diet for several days, patient reports liking spicy food including any [place] food yesterday.  Encouraged use of Pepcid twice daily for the next several days as well.    ED return instructions discussed: Encouraged return with worsening chest or abdominal pain, persistent vomiting, fevers, worsening of current symptoms.  Follow-up instructions discussed: She has a PCP appointment tomorrow and is encouraged to follow-up.                           Medical Decision Making Amount and/or Complexity of Data Reviewed Labs: ordered.  Risk OTC drugs. Prescription drug management.   For this patient's complaint of chest pain, the following emergent conditions were considered on the differential diagnosis: acute coronary syndrome, pulmonary embolism, pneumothorax, myocarditis, pericardial tamponade, aortic dissection, thoracic aortic aneurysm complication, esophageal perforation.   Other causes were also considered including: gastroesophageal reflux disease, musculoskeletal pain including costochondritis, pneumonia/pleurisy, herpes zoster, pericarditis.  In regards to possibility of ACS, patient has atypical features of pain, non-ischemic and unchanged EKG and negative  troponin(s).   In regards to possibility of PE, symptoms are atypical for PE and feel PE low likelihood.   For this patient's complaint of abdominal pain, the following conditions were considered on the differential diagnosis: gastritis/PUD, enteritis/duodenitis, appendicitis, cholelithiasis/cholecystitis, cholangitis, pancreatitis, ruptured viscus, colitis, diverticulitis, small/large bowel obstruction, proctitis, cystitis, pyelonephritis, ureteral colic, aortic dissection, aortic aneurysm. In women, pelvic inflammatory disease, ovarian cysts, and tubo-ovarian abscess were also considered. Atypical chest etiologies were also considered including ACS, PE, and pneumonia.   The patient's vital signs, pertinent lab work and imaging were reviewed and interpreted as discussed in the ED course. Hospitalization was considered for further testing, treatments, or serial exams/observation. However as patient is well-appearing, has a stable exam, and reassuring studies today, I do not feel that they warrant admission at this time. This plan was discussed with the patient who verbalizes agreement and comfort with this plan and seems reliable and able to return to the Emergency Department with worsening or changing symptoms.         Final Clinical Impression(s) / ED Diagnoses Final diagnoses:  Chest tightness    Rx / DC Orders ED Discharge Orders     None         Carlisle Cater, PA-C 03/23/22 1534    Pattricia Boss, MD 03/26/22 1027

## 2022-03-23 NOTE — Discharge Instructions (Signed)
Please read and follow all provided instructions.  Your diagnoses today include:  1. Chest tightness    Tests performed today include: An EKG of your heart: stable from previous Cardiac enzymes - a blood test for heart muscle damage, unchanged and normal Liver function test and pancreas test - no problems Vital signs. See below for your results today.   Medications prescribed:  None  Take any prescribed medications only as directed.  Home treatments: You may continue taking Pepcid twice a day to see if this helps your symptoms in addition to the   Follow-up instructions: Please follow-up with your primary care provider as soon as you can for further evaluation of your symptoms.   Return instructions:  SEEK IMMEDIATE MEDICAL ATTENTION IF: You have severe chest pain, especially if the pain is crushing or pressure-like and spreads to the arms, back, neck, or jaw, or if you have sweating, nausea or vomiting, or trouble with breathing. THIS IS AN EMERGENCY. Do not wait to see if the pain will go away. Get medical help at once. Call 911. DO NOT drive yourself to the hospital.  Your chest pain gets worse and does not go away after a few minutes of rest.  You have an attack of chest pain lasting longer than what you usually experience.  You have significant dizziness, if you pass out, or have trouble walking.  You have chest pain not typical of your usual pain for which you originally saw your caregiver.  You have any other emergent concerns regarding your health.  Additional Information: Chest pain comes from many different causes. Your caregiver has diagnosed you as having chest pain that is not specific for one problem, but does not require admission.  You are at low risk for an acute heart condition or other serious illness.   Your vital signs today were: BP (!) 169/56   Pulse (!) 54   Temp 97.9 F (36.6 C) (Oral)   Resp 15   Ht 5' (1.524 m)   Wt 53.5 kg   SpO2 99%   BMI 23.05  kg/m  If your blood pressure (BP) was elevated above 135/85 this visit, please have this repeated by your doctor within one month. --------------

## 2022-03-24 DIAGNOSIS — K2100 Gastro-esophageal reflux disease with esophagitis, without bleeding: Secondary | ICD-10-CM | POA: Diagnosis not present

## 2022-03-24 DIAGNOSIS — K5010 Crohn's disease of large intestine without complications: Secondary | ICD-10-CM | POA: Diagnosis not present

## 2022-03-24 DIAGNOSIS — I129 Hypertensive chronic kidney disease with stage 1 through stage 4 chronic kidney disease, or unspecified chronic kidney disease: Secondary | ICD-10-CM | POA: Diagnosis not present

## 2022-03-31 ENCOUNTER — Telehealth: Payer: Self-pay

## 2022-03-31 NOTE — Telephone Encounter (Signed)
        Patient  visited [facility] on 12/19    Telephone encounter attempt :  1st  A HIPAA compliant voice message was left requesting a return call.  Instructed patient to call back     [facility]  [phone] [address], [place], [place] [address] Phone: [phone] Email: [email or web address]

## 2022-04-01 ENCOUNTER — Telehealth: Payer: Self-pay

## 2022-04-01 NOTE — Telephone Encounter (Signed)
        Patient  visited [facility] on 12/19   Telephone encounter attempt :  2nd  Unable to leave a message    [facility]  [phone] [address], [place], [place] [address] Phone: [phone] Email: [email or web address]

## 2022-04-08 DIAGNOSIS — I10 Essential (primary) hypertension: Secondary | ICD-10-CM | POA: Diagnosis not present

## 2022-04-08 DIAGNOSIS — E785 Hyperlipidemia, unspecified: Secondary | ICD-10-CM | POA: Diagnosis not present

## 2022-04-08 DIAGNOSIS — R7889 Finding of other specified substances, not normally found in blood: Secondary | ICD-10-CM | POA: Diagnosis not present

## 2022-04-08 DIAGNOSIS — K5010 Crohn's disease of large intestine without complications: Secondary | ICD-10-CM | POA: Diagnosis not present

## 2022-04-15 DIAGNOSIS — H9193 Unspecified hearing loss, bilateral: Secondary | ICD-10-CM | POA: Diagnosis not present

## 2022-04-15 DIAGNOSIS — R82998 Other abnormal findings in urine: Secondary | ICD-10-CM | POA: Diagnosis not present

## 2022-04-15 DIAGNOSIS — Z0000 Encounter for general adult medical examination without abnormal findings: Secondary | ICD-10-CM | POA: Diagnosis not present

## 2022-04-15 DIAGNOSIS — I10 Essential (primary) hypertension: Secondary | ICD-10-CM | POA: Diagnosis not present

## 2022-04-15 DIAGNOSIS — Z1331 Encounter for screening for depression: Secondary | ICD-10-CM | POA: Diagnosis not present

## 2022-04-15 DIAGNOSIS — K5010 Crohn's disease of large intestine without complications: Secondary | ICD-10-CM | POA: Diagnosis not present

## 2022-04-15 DIAGNOSIS — Z1339 Encounter for screening examination for other mental health and behavioral disorders: Secondary | ICD-10-CM | POA: Diagnosis not present

## 2022-04-15 DIAGNOSIS — Z23 Encounter for immunization: Secondary | ICD-10-CM | POA: Diagnosis not present

## 2022-04-15 DIAGNOSIS — E785 Hyperlipidemia, unspecified: Secondary | ICD-10-CM | POA: Diagnosis not present

## 2022-04-28 DIAGNOSIS — Z9622 Myringotomy tube(s) status: Secondary | ICD-10-CM | POA: Diagnosis not present

## 2022-04-28 DIAGNOSIS — H903 Sensorineural hearing loss, bilateral: Secondary | ICD-10-CM | POA: Diagnosis not present

## 2022-06-23 DIAGNOSIS — S80861A Insect bite (nonvenomous), right lower leg, initial encounter: Secondary | ICD-10-CM | POA: Diagnosis not present

## 2022-06-23 DIAGNOSIS — W57XXXA Bitten or stung by nonvenomous insect and other nonvenomous arthropods, initial encounter: Secondary | ICD-10-CM | POA: Diagnosis not present

## 2022-06-24 DIAGNOSIS — Z961 Presence of intraocular lens: Secondary | ICD-10-CM | POA: Diagnosis not present

## 2022-06-24 DIAGNOSIS — H52203 Unspecified astigmatism, bilateral: Secondary | ICD-10-CM | POA: Diagnosis not present

## 2022-06-24 DIAGNOSIS — H04123 Dry eye syndrome of bilateral lacrimal glands: Secondary | ICD-10-CM | POA: Diagnosis not present

## 2022-06-24 DIAGNOSIS — H524 Presbyopia: Secondary | ICD-10-CM | POA: Diagnosis not present

## 2022-06-29 ENCOUNTER — Emergency Department (HOSPITAL_BASED_OUTPATIENT_CLINIC_OR_DEPARTMENT_OTHER)
Admission: EM | Admit: 2022-06-29 | Discharge: 2022-06-29 | Disposition: A | Discharge status: Home / Self Care | Payer: Medicare HMO | Attending: Emergency Medicine | Admitting: Emergency Medicine

## 2022-06-29 ENCOUNTER — Other Ambulatory Visit: Payer: Self-pay

## 2022-06-29 ENCOUNTER — Encounter (HOSPITAL_BASED_OUTPATIENT_CLINIC_OR_DEPARTMENT_OTHER): Payer: Self-pay

## 2022-06-29 DIAGNOSIS — R21 Rash and other nonspecific skin eruption: Secondary | ICD-10-CM | POA: Insufficient documentation

## 2022-06-29 DIAGNOSIS — I10 Essential (primary) hypertension: Secondary | ICD-10-CM | POA: Diagnosis not present

## 2022-06-29 DIAGNOSIS — Z7982 Long term (current) use of aspirin: Secondary | ICD-10-CM | POA: Diagnosis not present

## 2022-06-29 DIAGNOSIS — Z79899 Other long term (current) drug therapy: Secondary | ICD-10-CM | POA: Diagnosis not present

## 2022-06-29 MED ORDER — PREDNISONE 20 MG PO TABS: ORAL_TABLET | ORAL | 0 refills | Status: DC

## 2022-06-29 MED ORDER — PREDNISONE 50 MG PO TABS
50.0000 mg | ORAL_TABLET | Freq: Once | ORAL | Status: AC
  Administered 2022-06-29: 50 mg via ORAL
  Filled 2022-06-29: qty 1

## 2022-06-29 NOTE — ED Notes (Signed)
RN reviewed discharge instructions with pt. Pt verbalized understanding and had no further questions. VSS upon discharge.  

## 2022-06-29 NOTE — ED Triage Notes (Signed)
Pt was bit by a tick, infection to right lower leg where she was bit, was prescribed doxycycline and developed rash, discontinued doxycycline yesterday but continues to have rash, now with redness noted to face, denies SOB or difficulty swallowing. Denies swelling to tongue or throat.

## 2022-06-29 NOTE — ED Provider Notes (Signed)
[facility] Provider Note   CSN: [id number] Arrival date & time: 06/29/22  2154     History  Chief Complaint  Patient presents with   Allergic Reaction    Pt was bit by a tick, infection to right lower leg where she was bit, was prescribed doxycycline and developed rash, discontinued doxycycline yesterday but continues to have rash, now with redness noted to face, denies SOB or difficulty swallowing. Denies swelling to tongue or throat.    Sandra Yang is a 89 y.o. female.  89 y.o female with a PMH of HTN presents to the ED with a chief complaint of rash which began a couple of days ago. Patient was placed on doxycline due to a tick bite earlier this week, she reports taking the medication after the medication was started then rash move from her legs to her trunk to her back and all over. She now has flushness to her face which is what concerned and prompted her visit to the ED. She does not have any prior hx of allergic reaction to doxycline, no systemic signs such as fever, abdominal pain or vomiting.   The history is provided by the patient and a relative.  Allergic Reaction Presenting symptoms: rash        Home Medications Prior to Admission medications   Medication Sig Start Date End Date Taking? Authorizing Provider  predniSONE (DELTASONE) 20 MG tablet 2 tabs po daily x 4 days 06/29/22  Yes Sherwood Gambler, MD  amLODipine (NORVASC) 5 MG tablet Take 1 tablet (5 mg total) by mouth daily. 04/27/15   Nita Sells, MD  aspirin 81 MG tablet Take 81 mg by mouth daily.      [provider]  Cholecalciferol (VITAMIN D) 2000 UNITS CAPS Take 2,000 Units by mouth daily.     [provider]  fenofibrate 160 MG tablet Take 160 mg by mouth daily.      [provider]  glycopyrrolate (ROBINUL) 1 MG tablet Take 1 tablet (1 mg total) by mouth 2 (two) times daily as needed. 02/02/22   Ladene Artist, MD   hyoscyamine (LEVSIN SL) 0.125 MG SL tablet PLACE 1-2 TABLET UNDER THE TONGUE EVERY 4 HOURS AS NEEDED FOR ABDOMINAL PAIN 02/02/22   Ladene Artist, MD  KRILL OIL PO Take 1 tablet by mouth daily.    [provider]  mesalamine (LIALDA) 1.2 g EC tablet Take 1 tablet (1.2 g total) by mouth daily with breakfast. 02/02/22   Ladene Artist, MD  metoprolol succinate (TOPROL-XL) 25 MG 24 hr tablet Take 25 mg by mouth daily.    [provider]  omeprazole (PRILOSEC) 20 MG capsule Take 1 capsule (20 mg total) by mouth daily. 02/02/22   Ladene Artist, MD  triamterene-hydrochlorothiazide (MAXZIDE-25) 37.5-25 MG per tablet Take 1 tablet by mouth daily.  11/09/11   [provider]      Allergies    Atorvastatin, Doxycycline, Erythromycin, Tetanus toxoids, and Tape    Review of Systems   Review of Systems  Constitutional:  Negative for fever.  Skin:  Positive for rash.    Physical Exam Updated Vital Signs BP (!) 172/59   Pulse 61   Temp 97.8 F (36.6 C) (Oral)   Resp 17   Ht 5' (1.524 m)   Wt 53.1 kg   SpO2 100%   BMI 22.85 kg/m  Physical Exam Vitals and nursing note reviewed.  Constitutional:  Appearance: Normal appearance.  HENT:     Head: Normocephalic and atraumatic.     Mouth/Throat:     Mouth: Mucous membranes are moist.  Eyes:     Pupils: Pupils are equal, round, and reactive to light.  Cardiovascular:     Rate and Rhythm: Normal rate.  Pulmonary:     Effort: Pulmonary effort is normal.  Abdominal:     General: Abdomen is flat.  Musculoskeletal:     Cervical back: Normal range of motion and neck supple.  Skin:    General: Skin is warm and dry.     Findings: Erythema and rash present. Rash is urticarial.     Comments: Urticarial rash throughout her legs, trunk, chest, back.  Neurological:     Mental Status: She is alert and oriented to person, place, and time.     ED Results / Procedures / Treatments   Labs (all labs ordered are  listed, but only abnormal results are displayed) Labs Reviewed - No data to display  EKG None  Radiology No results found.  Procedures Procedures    Medications Ordered in ED Medications  predniSONE (DELTASONE) tablet 50 mg (50 mg Oral Given 06/29/22 2238)    ED Course/ Medical Decision Making/ A&P                             Medical Decision Making Risk Prescription drug management.   Patient here with rash which began after couple days of doxycycline.  She reports rash began a couple days after starting doxycycline.  She has not discontinued this.  She did have a rash to her face today, reports he felt sort of looking sunburn.  No systemic signs, no mucosal involvement to be concern for erythema multiforme a.  No fevers, no abdominal pain, no nausea or vomiting.  Occur worse throughout her legs along with trunk and chest and back.  I do not feel that rash looks infectious.  She is overall nontoxic-appearing, with stable vital signs.  Discussed this case with my attending Dr. Ardith Dark who agrees with plan and treatment.  Patient is overall well-appearing, we discussed ordering a short course of steroids to help with symptoms.  She is agreeable to plan and management.  Patient is stable for discharge.    Portions of this note were generated with [employer]. Dictation errors may occur despite best attempts at proofreading.   Final Clinical Impression(s) / ED Diagnoses Final diagnoses:  Rash    Rx / DC Orders ED Discharge Orders          Ordered    predniSONE (DELTASONE) 20 MG tablet        06/29/22 2235              [name], PA-C 06/29/22 2253    Sherwood Gambler, MD 07/03/22 1625

## 2022-06-29 NOTE — Discharge Instructions (Addendum)
You were place on a short course of steroids to help with your symptoms. Please take 2 tablets daily for the next 4 days.  Follow up with your primary care physician as needed.

## 2022-06-30 ENCOUNTER — Telehealth: Payer: Self-pay

## 2022-06-30 NOTE — Telephone Encounter (Signed)
        Patient  visited [facility] on 3/26    Telephone encounter attempt : 1st   A HIPAA compliant voice message was left requesting a return call.  Instructed patient to call back .   [facility] [phone] [address], [place], [place] [address] Phone: [phone] Email: [email or web address]

## 2022-07-01 ENCOUNTER — Telehealth: Payer: Self-pay

## 2022-07-01 NOTE — Telephone Encounter (Signed)
        Patient  visited [facility] on 3/26     Telephone encounter attempt :  2nd  A HIPAA compliant voice message was left requesting a return call.  Instructed patient to call back    [facility] [phone] [address], [place], [place] [address] Phone: [phone] Email: [email or web address]

## 2022-07-01 NOTE — Telephone Encounter (Signed)
     Patient  visit on 3/26  at [facility]   Have you been able to follow up with your primary care physician? Yes   The patient was or was not able to obtain any needed medicine or equipment. Yes   Are there diet recommendations that you are having difficulty following? Na   Patient expresses understanding of discharge instructions and education provided has no other needs at this time.  Yes    [facility] [phone] [address], [place], [place] [address] Phone: [phone] Email: [email or web address]

## 2022-07-09 ENCOUNTER — Telehealth: Payer: Self-pay | Admitting: Pharmacy Technician

## 2022-07-09 NOTE — Telephone Encounter (Signed)
Patient Advocate Encounter  Received notification from [facility] that prior authorization for HYOSCYAMINE 0.125MG  is required.   PA submitted on 4.5.24 Key [id number] Status is pending

## 2022-07-13 NOTE — Telephone Encounter (Signed)
Pharmacy Patient Advocate Encounter  Received notification from [facility] that the request for prior authorization for HYOSCYAMINE 0.125MG  has been denied due to .    Please be advised we currently do not have a Pharmacist to review denials, therefore you will need to process appeals accordingly as needed. Thanks for your support at this time.   You may call [phone] or fax [phone], to appeal.

## 2022-07-14 NOTE — Telephone Encounter (Signed)
Inbound call from patient returning call. Please advise. 

## 2022-07-14 NOTE — Telephone Encounter (Signed)
Left message for patient to return my call.

## 2022-07-15 NOTE — Telephone Encounter (Signed)
Informed patient of the denial of hyoscyamine. Patient states she really only takes it once a month, if that. Informed patient she can try a Goodrx card when she is due for a refill to make it around $10 at [facility]. Patient verbalized understanding.

## 2022-09-06 DIAGNOSIS — E782 Mixed hyperlipidemia: Secondary | ICD-10-CM | POA: Diagnosis not present

## 2022-09-06 DIAGNOSIS — K5090 Crohn's disease, unspecified, without complications: Secondary | ICD-10-CM | POA: Diagnosis not present

## 2022-09-06 DIAGNOSIS — K219 Gastro-esophageal reflux disease without esophagitis: Secondary | ICD-10-CM | POA: Diagnosis not present

## 2022-09-06 DIAGNOSIS — I10 Essential (primary) hypertension: Secondary | ICD-10-CM | POA: Diagnosis not present

## 2022-09-08 DIAGNOSIS — H903 Sensorineural hearing loss, bilateral: Secondary | ICD-10-CM | POA: Diagnosis not present

## 2022-09-08 DIAGNOSIS — Z9622 Myringotomy tube(s) status: Secondary | ICD-10-CM | POA: Diagnosis not present

## 2022-10-12 DIAGNOSIS — R600 Localized edema: Secondary | ICD-10-CM | POA: Diagnosis not present

## 2022-10-12 DIAGNOSIS — K2100 Gastro-esophageal reflux disease with esophagitis, without bleeding: Secondary | ICD-10-CM | POA: Diagnosis not present

## 2022-10-12 DIAGNOSIS — K5010 Crohn's disease of large intestine without complications: Secondary | ICD-10-CM | POA: Diagnosis not present

## 2022-10-12 DIAGNOSIS — H9193 Unspecified hearing loss, bilateral: Secondary | ICD-10-CM | POA: Diagnosis not present

## 2022-10-12 DIAGNOSIS — I10 Essential (primary) hypertension: Secondary | ICD-10-CM | POA: Diagnosis not present

## 2023-02-04 ENCOUNTER — Other Ambulatory Visit: Payer: Self-pay | Admitting: Internal Medicine

## 2023-02-04 DIAGNOSIS — Z1231 Encounter for screening mammogram for malignant neoplasm of breast: Secondary | ICD-10-CM

## 2023-02-22 ENCOUNTER — Encounter: Payer: Self-pay | Admitting: Gastroenterology

## 2023-02-22 ENCOUNTER — Ambulatory Visit: Payer: Medicare HMO | Admitting: Gastroenterology

## 2023-02-22 ENCOUNTER — Other Ambulatory Visit (INDEPENDENT_AMBULATORY_CARE_PROVIDER_SITE_OTHER): Payer: Medicare HMO

## 2023-02-22 VITALS — BP 132/74 | HR 58 | Ht 60.0 in | Wt 122.0 lb

## 2023-02-22 DIAGNOSIS — Z860101 Personal history of adenomatous and serrated colon polyps: Secondary | ICD-10-CM

## 2023-02-22 DIAGNOSIS — R197 Diarrhea, unspecified: Secondary | ICD-10-CM

## 2023-02-22 DIAGNOSIS — K50118 Crohn's disease of large intestine with other complication: Secondary | ICD-10-CM

## 2023-02-22 LAB — CBC WITH DIFFERENTIAL/PLATELET
Basophils Absolute: 0 10*3/uL (ref 0.0–0.1)
Basophils Relative: 0.7 % (ref 0.0–3.0)
Eosinophils Absolute: 0.1 10*3/uL (ref 0.0–0.7)
Eosinophils Relative: 2.2 % (ref 0.0–5.0)
HCT: 42.1 % (ref 36.0–46.0)
Hemoglobin: 14 g/dL (ref 12.0–15.0)
Lymphocytes Relative: 24.7 % (ref 12.0–46.0)
Lymphs Abs: 1.2 10*3/uL (ref 0.7–4.0)
MCHC: 33.3 g/dL (ref 30.0–36.0)
MCV: 92.7 fL (ref 78.0–100.0)
Monocytes Absolute: 0.3 10*3/uL (ref 0.1–1.0)
Monocytes Relative: 5.6 % (ref 3.0–12.0)
Neutro Abs: 3.2 10*3/uL (ref 1.4–7.7)
Neutrophils Relative %: 66.8 % (ref 43.0–77.0)
Platelets: 187 10*3/uL (ref 150.0–400.0)
RBC: 4.54 Mil/uL (ref 3.87–5.11)
RDW: 13.3 % (ref 11.5–15.5)
WBC: 4.8 10*3/uL (ref 4.0–10.5)

## 2023-02-22 LAB — COMPREHENSIVE METABOLIC PANEL
ALT: 11 U/L (ref 0–35)
AST: 19 U/L (ref 0–37)
Albumin: 4.3 g/dL (ref 3.5–5.2)
Alkaline Phosphatase: 64 U/L (ref 39–117)
BUN: 17 mg/dL (ref 6–23)
CO2: 28 meq/L (ref 19–32)
Calcium: 9.9 mg/dL (ref 8.4–10.5)
Chloride: 102 meq/L (ref 96–112)
Creatinine, Ser: 0.89 mg/dL (ref 0.40–1.20)
GFR: 58.19 mL/min — ABNORMAL LOW (ref >60.00)
Glucose, Bld: 89 mg/dL (ref 70–99)
Potassium: 4.4 meq/L (ref 3.5–5.1)
Sodium: 138 meq/L (ref 135–145)
Total Bilirubin: 0.5 mg/dL (ref 0.2–1.2)
Total Protein: 7.1 g/dL (ref 6.0–8.3)

## 2023-02-22 LAB — C-REACTIVE PROTEIN: CRP: <1 mg/dL (ref 0.5–20.0)

## 2023-02-22 LAB — SEDIMENTATION RATE: Sed Rate: 14 mm/h (ref 0–30)

## 2023-02-22 LAB — TSH: TSH: 1.58 u[IU]/mL (ref 0.35–5.50)

## 2023-02-22 MED ORDER — MESALAMINE 1.2 G PO TBEC: 2.4000 g | DELAYED_RELEASE_TABLET | Freq: Two times a day (BID) | ORAL | 0 refills | Status: DC

## 2023-02-22 NOTE — Progress Notes (Signed)
    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 Doy Hutching   HPI    This is an 89 year old female with Crohn's colitis reporting increased diarrhea for a few months.  She notes between 3 and 6 loose urgent bowel movements daily.  The majority of her bowel movements are in the morning.  She has mild left lower quadrant discomfort as well.  She notes an oily consistency to stools on occasion.  Her weight is stable.  Her last colonoscopy was in 2016 showing 3 small tubular adenomas and a patent ascending colon anastomosis.   Labs / Imaging       Latest Ref Rng & Units 03/23/2022    1:13 PM 04/06/2017    3:25 PM 04/20/2011   10:58 AM  Hepatic Function  Total Protein 6.5 - 8.1 g/dL 6.3  6.9  8.5   Albumin 3.5 - 5.0 g/dL 3.9  4.2  4.6   AST 15 - 41 U/L 19  19  34   ALT 0 - 44 U/L 11  19  38   Alk Phosphatase 38 - 126 U/L 42  46  58   Total Bilirubin 0.3 - 1.2 mg/dL 0.6  0.5  1.3        Latest Ref Rng & Units 03/22/2022   11:55 PM 04/06/2017    3:25 PM 04/26/2015    1:04 AM  CBC  WBC 4.0 - 10.5 K/uL 4.1  4.9  4.8   Hemoglobin 12.0 - 15.0 g/dL 40.9  81.1  91.4   Hematocrit 36.0 - 46.0 % 38.6  39.0  42.9   Platelets 150 - 400 K/uL 177  193.0  191     Current Medications, Allergies, Past Medical History, Past Surgical History, Family History and Social History were reviewed in [employer] record.   Physical Exam: General: Well developed, well nourished, appears younger than stated age, no acute distress Head: Normocephalic and atraumatic Eyes:  Sclerae anicteric, EOMI Ears: Normal auditory acuity Mouth: No deformities or lesions noted Lungs: Clear throughout to auscultation Heart: Regular rate and rhythm; No murmurs, rubs or bruits Abdomen: Soft, non tender and non distended. No masses, hepatosplenomegaly or hernias noted. Normal Bowel sounds Rectal: Not done Musculoskeletal: Symmetrical with no gross deformities  Pulses:  Normal pulses noted Extremities: No edema or deformities noted Neurological: Alert oriented x 4, grossly nonfocal Psychological:  Alert and cooperative. Normal mood and affect   Wane Mollett T. Russella Dar, MD 02/22/2023, 11:19 AM

## 2023-02-22 NOTE — Patient Instructions (Addendum)
Your provider has requested that you go to the basement level for lab work before leaving today. Press "B" on the elevator. The lab is located at the first door on the left as you exit the elevator.  Increase your Lialda to 2.4 grams twice daily. A new prescription has been sent to your pharmacy.   We have scheduled a follow up visit with Dr. Doy Hutching for 04/12/23 at 10:50 am.   The [facility] providers would like to encourage you to use [facility] to communicate with providers for non-urgent requests or questions.  Due to long hold times on the telephone, sending your provider a message by [facility] may be a faster and more efficient way to get a response.  Please allow 48 business hours for a response.  Please remember that this is for non-urgent requests.   Due to recent changes in healthcare laws, you may see the results of your imaging and laboratory studies on MyChart before your provider has had a chance to review them.  We understand that in some cases there may be results that are confusing or concerning to you. Not all laboratory results come back in the same time frame and the provider may be waiting for multiple results in order to interpret others.  Please give [place] 48 hours in order for your provider to thoroughly review all the results before contacting the office for clarification of your results.   Thank you for choosing me and [facility].  Venita Lick. Pleas Koch., MD., Clementeen Graham

## 2023-02-23 ENCOUNTER — Telehealth: Payer: Self-pay | Admitting: Gastroenterology

## 2023-02-23 NOTE — Telephone Encounter (Signed)
Patient called and requested to speak to PJ. Patient refused to let me know what she was calling about. I called PJ to figured out if she possible called her and she informed that Patty had called her. I informed that patient and she requested a call back from PJ. Please advise.

## 2023-02-23 NOTE — Telephone Encounter (Signed)
I have spoken to patient. She wanted to change her appointment with Dr Doy Hutching to a Wednesday afternoon so her daughter could come with her to her appointment. Appointment changed to 04/20/23 at 340 pm. Patient verbalizes understanding and denies any additional questions or concerns at this time.

## 2023-02-24 ENCOUNTER — Other Ambulatory Visit: Payer: Medicare HMO

## 2023-02-24 DIAGNOSIS — K50118 Crohn's disease of large intestine with other complication: Secondary | ICD-10-CM | POA: Diagnosis not present

## 2023-02-26 LAB — GI PROFILE, STOOL, PCR

## 2023-02-27 LAB — CALPROTECTIN, FECAL: Calprotectin, Fecal: 150 ug/g — ABNORMAL HIGH (ref 0–120)

## 2023-02-28 ENCOUNTER — Telehealth: Payer: Self-pay

## 2023-02-28 NOTE — Telephone Encounter (Signed)
-----   Message from Philip T. Russella Dar sent at 02/28/2023 10:56 AM EST ----- Regarding: FW: GI stool profile is negative  Fecal calprotectin is mildly elevated   See recent office note. If her symptoms have not significantly improved on increase dosing of Lialda then add Entocort 9 mg po qd x 8 weeks ----- Message ----- From: Interface, [facility] Lab Results In Sent: 02/26/2023   9:37 AM EST To: Meryl Dare, MD

## 2023-02-28 NOTE — Telephone Encounter (Signed)
Left message on machine to call back  

## 2023-03-01 NOTE — Telephone Encounter (Signed)
The pt has been advised of the results. She tells me that she has done very well that the increase of Lialda has helped greatly.  She will call if she does not continue to improve or symptoms return

## 2023-03-05 LAB — PANCREATIC ELASTASE, FECAL: Pancreatic Elastase-1, Stool: 590 ug/g (ref >200)

## 2023-03-08 ENCOUNTER — Ambulatory Visit
Admission: RE | Admit: 2023-03-08 | Discharge: 2023-03-08 | Disposition: A | Discharge status: Home / Self Care | Payer: Medicare HMO | Source: Ambulatory Visit | Attending: Internal Medicine | Admitting: Internal Medicine

## 2023-03-08 DIAGNOSIS — Z1231 Encounter for screening mammogram for malignant neoplasm of breast: Secondary | ICD-10-CM

## 2023-03-29 ENCOUNTER — Other Ambulatory Visit: Payer: Self-pay | Admitting: Gastroenterology

## 2023-04-12 ENCOUNTER — Ambulatory Visit: Payer: Medicare HMO | Admitting: Pediatrics

## 2023-04-20 ENCOUNTER — Other Ambulatory Visit: Payer: Medicare HMO

## 2023-04-20 ENCOUNTER — Encounter: Payer: Self-pay | Admitting: Pediatrics

## 2023-04-20 ENCOUNTER — Ambulatory Visit: Payer: Medicare HMO | Admitting: Pediatrics

## 2023-04-20 VITALS — BP 150/62 | HR 77 | Ht 59.0 in | Wt 122.0 lb

## 2023-04-20 DIAGNOSIS — R197 Diarrhea, unspecified: Secondary | ICD-10-CM

## 2023-04-20 DIAGNOSIS — K50118 Crohn's disease of large intestine with other complication: Secondary | ICD-10-CM

## 2023-04-20 DIAGNOSIS — Z860101 Personal history of adenomatous and serrated colon polyps: Secondary | ICD-10-CM | POA: Diagnosis not present

## 2023-04-20 DIAGNOSIS — K219 Gastro-esophageal reflux disease without esophagitis: Secondary | ICD-10-CM | POA: Diagnosis not present

## 2023-04-20 MED ORDER — MESALAMINE 1.2 G PO TBEC: 2.4000 g | DELAYED_RELEASE_TABLET | Freq: Two times a day (BID) | ORAL | 1 refills | Status: DC

## 2023-04-20 MED ORDER — HYOSCYAMINE SULFATE 0.125 MG SL SUBL: SUBLINGUAL_TABLET | SUBLINGUAL | 1 refills | Status: DC

## 2023-04-20 NOTE — Patient Instructions (Signed)
 Your provider has requested that you go to the basement level for lab work before leaving today. Press "B" on the elevator. The lab is located at the first door on the left as you exit the elevator.  _______________________________________________________  If your blood pressure at your visit was 140/90 or greater, please contact your primary care physician to follow up on this.  _______________________________________________________  If you are age 88 or older, your body mass index should be between 23-30. Your Body mass index is 24.64 kg/m. If this is out of the aforementioned range listed, please consider follow up with your Primary Care Provider.  If you are age 32 or younger, your body mass index should be between 19-25. Your Body mass index is 24.64 kg/m. If this is out of the aformentioned range listed, please consider follow up with your Primary Care Provider.   ________________________________________________________  The [facility] providers would like to encourage you to use MYCHART to communicate with providers for non-urgent requests or questions.  Due to long hold times on the telephone, sending your provider a message by [facility] may be a faster and more efficient way to get a response.  Please allow 48 business hours for a response.  Please remember that this is for non-urgent requests.   It was a pleasure to see you today!  Thank you for trusting me with your gastrointestinal care!    Eugenia Hess, MD

## 2023-04-20 NOTE — Progress Notes (Signed)
 [facility] Return Visit   Referring Provider Bertha Broad, MD [address] [place],  [place] [address]  Primary Care Provider Bertha Broad, MD  Patient Profile: Sandra Yang is a 89 y.o. female with a past medical history noteworthy for hyperlipidemia and hypertension who returns to the [facility] for follow-up of the problem(s) noted below.  Problem List: Non stricturing, nonpenetrating Crohn's colitis diagnosed 2007 Diarrhea, fecal urgency and incontinence Personal history of a tubulovillous adenomatous colon polyp - S/P right hemicolectomy 2001 GERD  History of Present Illness   Sandra Yang was last seen in the GI office 02/22/2023 by Dr. Sandrea Cruel   Current GI Meds  Lialda  2.4 g p.o. twice daily Hyoscyamine  0.125 mg sublingual every 4 hours as needed for abdominal pain Omeprazole  20 mg orally daily  Interval History  At [facility] last visit 02/2018 she endorsed increasing diarrhea for a few months Laboratory studies including CBC, CMP, TSH, ESR, CRP with normal Fecal calprotectin elevated at 150; GI stool profile negative, fecal last 2 days 590 Given elevated fecal calprotectin, she was advised to increase Lialda  from 2.4 g daily to 2.4 g po BID  Sandra Yang reports that dose optimization of her Lialda  has yielded improvement in her symptoms but incomplete resolution of her diarrhea States that on "good days" she will have 2 formed bowel movements a day without any blood or mucus On "bad days" she will sometimes have up to 4 loose, watery bowel movements clustered in the morning or after meals She endorses abdominal cramping and bloating with diarrhea Notes that the use of hyoscyamine  has ameliorated bloating and she will take 2 pills some days Has had rare episodes of incontinence No nocturnal bowel movements Endorses spicy food as being a trigger for some of her symptoms  Denies unexplained fevers, chills, joint pain, skin  rashes or oral ulcers  Weight, energy and appetite are stable  GERD symptoms are currently well-controlled on omeprazole  20 mg orally daily Denies pyrosis, regurgitation, dysphagia or odynophagia  Last colonoscopy: 03/2015 -no active inflammation, 4 sessile polyps in the descending colon - 3 TAS + benign mucosa Last endoscopy: 09/2003 - normal  Last Abd CT/CTE/MRE: CTAP 04/2017 -no acute abnormality; post partial colonic resection admitted she had difficulty towards the left and small bowel towards the right, pelvic floor laxity with mild prolapse of bladder and rectum, mild fatty infiltration of liver  GI Review of Symptoms Significant for diarrhea, fecal urgency, rare incontinence. Otherwise negative.  General Review of Systems  Review of systems is significant for the pertinent positives and negatives as listed per the HPI.  Full ROS is otherwise negative.   Inflammatory Bowel Disease History  2007 -diagnosed with Crohn's colitis managed with oral mesalamine   IBD Medication History Oral mesalamine   Past Medical History   Past Medical History:  Diagnosis Date   Allergy    Crohn's colitis (HCC) 10/2008   GERD (gastroesophageal reflux disease)    Hypertension    Tubulovillous adenoma polyp of colon 03/1999      Past Surgical History   Past Surgical History:  Procedure Laterality Date   APPENDECTOMY     BREAST EXCISIONAL BIOPSY Right 2019   CHOLECYSTECTOMY  2001   FOOT SURGERY Left    HAND SURGERY Left    HEMICOLECTOMY  2001   RADIOACTIVE SEED GUIDED EXCISIONAL BREAST BIOPSY Right 04/13/2017   Procedure: RIGHT RADIOACTIVE SEED GUIDED EXCISIONAL BREAST BIOPSY ERAS PATHWAY;  Surgeon: Enid Harry, MD;  Location: [facility];  Service: General;  Laterality: Right;  LMA   VAGINAL HYSTERECTOMY     total      Allergies and Medications   Allergies  Allergen Reactions   Atorvastatin Other (See Comments)   Doxycycline Hives   Erythromycin Itching    Tetanus Toxoids Swelling    Swelling of arm   Tape Rash    Current Meds  Medication Sig   aspirin  81 MG tablet Take 81 mg by mouth daily.     metoprolol  succinate (TOPROL -XL) 25 MG 24 hr tablet Take 25 mg by mouth daily.   MULTIPLE VITAMIN PO Take 1 tablet by mouth daily.   omeprazole  (PRILOSEC) 20 MG capsule TAKE ONE CAPSULE BY MOUTH ONE TIME DAILY   triamterene -hydrochlorothiazide  (MAXZIDE -25) 37.5-25 MG per tablet Take 1 tablet by mouth daily.    [DISCONTINUED] hyoscyamine  (LEVSIN SL) 0.125 MG SL tablet PLACE 1-2 TABLET UNDER THE TONGUE EVERY 4 HOURS AS NEEDED FOR ABDOMINAL PAIN     Family History   Family History  Problem Relation Age of Onset   Stroke Mother    Prostate cancer Father    Bone cancer Father    Breast cancer Sister    Colon cancer Neg Hx    Stomach cancer Neg Hx     Social History   Social History   Social History Narrative   Kyaira is widowed   She has children who reside in the local area   She is a previous smoker and quit in 1980   Social alcohol  consumption   Employed at [employer] and continue to work 1 day a week   Hilma reports that she quit smoking about 43 years ago. Her smoking use included cigarettes. She has never used smokeless tobacco. She reports current alcohol  use of about 2.0 standard drinks of alcohol  per week. She reports that she does not use drugs.  Vital Signs and Physical Examination   Vitals:   04/20/23 1531  BP: (!) 150/62  Pulse: 77   Body mass index is 24.64 kg/m. Weight: 122 lb (55.3 kg)  Constitutional: healthy appearing nontoxic appearing 89 y.o. female in no apparent distress.  Appears younger than stated age Eyes: Clear sclerae Head: Normocephalic/atraumatic Neck: Normal Pulmonary: Clear to auscultation bilaterally CV: S1, S2, regular rate and rhythm GI/Abdomen: Soft, nontender, nondistended, normoactive bowel sounds Anorectal: Deferred Neuro: Nonfocal Skin: Warm, dry, no rash Bilateral  Extremities: Normal   Review of Data  The following data was reviewed at the time of this encounter:  Laboratory Studies   Lab Results  Component Value Date   WBC 4.8 02/22/2023   HGB 14.0 02/22/2023   HCT 42.1 02/22/2023   MCV 92.7 02/22/2023   PLT 187.0 02/22/2023     Chemistry      Component Value Date/Time   NA 138 02/22/2023 1153   K 4.4 02/22/2023 1153   CL 102 02/22/2023 1153   CO2 28 02/22/2023 1153   BUN 17 02/22/2023 1153   CREATININE 0.89 02/22/2023 1153      Component Value Date/Time   CALCIUM 9.9 02/22/2023 1153   ALKPHOS 64 02/22/2023 1153   AST 19 02/22/2023 1153   ALT 11 02/22/2023 1153   BILITOT 0.5 02/22/2023 1153      Lab Results  Component Value Date   TSH 1.58 02/22/2023   Lab Results  Component Value Date   CRP <1.0 02/22/2023   Lab Results  Component Value Date   ESRSEDRATE 14 02/22/2023  02/2023  GI stool profile negative Fecal elastase 590 Fecal calprotectin 150  Imaging Studies  CTAP 04/2017 No acute abnormality of the abdomen or pelvis.   Post partial colonic resection with mild shift of colon towards the left and small bowel towards the right.   Pelvic floor laxity with mild prolapse urinary bladder and rectum.   Mild fatty infiltration of the liver.   Liver and renal cysts incidentally noted.   Aortic Atherosclerosis (ICD10-I70.0).   Degenerative changes L2-3 through L5-S1.  CTAP 03/2006 Findings: Mild wall thickening in the entire remaining colon, greatest in the descending/left colon noted. No evidence of free fluid, bowel obstruction, or pneumoperitoneum. The remainder of the bowel and bladder are unremarkable. Evidence of prior proximal partial colectomy noted. The visualized portions of the celiac artery, SMA, IMA, and SMV are patent.   IMPRESSION:  Colitis, greatest in the left colon - ischemia not excluded.     GI Procedures and Studies  Colonoscopy 03/18/2015 No active inflammation, 4 sessile polyps in  the descending colon - 3TAS + benign mucosa  Additional previous endoscopic studies were reviewed in EPIC  Clinical Impression  Sandra is my clinical impression that Sandra Yang is a 89 y.o. female with;  Non stricturing, nonpenetrating Crohn's colitis diagnosed 2007 Diarrhea, fecal urgency and incontinence Personal history of a tubulovillous adenomatous colon polyp - S/P right hemicolectomy 2001 GERD  Sandra Yang was diagnosed with stricturing, non penetrating Crohn's colitis in 2007.  She has primarily been managed with oral mesalamine .  At the time of her last clinic visit 02/2023 she endorsed increasing symptoms of diarrhea.  Laboratory testing at that time was unremarkable with the exception of elevated fecal calprotectin 150.  Stool pathogen panel was negative.  She was advised to increase Lialda  from 2.4 g daily to 2.4 g p.o. twice daily.  She reports that this resulted in improvement in her symptoms.  Today, Sandra Yang reports that her diarrhea and bloating better than in the fall but she continues to experience intermittent loose stool, fecal urgency and rare episodes of incontinence.  These episodes of loose stool are punctuated by days of normal bowel movements.  We discussed that Sandra is unclear if there is truly ongoing active inflammation or if there may be another process contributing to her symptoms.  As such, I have suggested repeat fecal calprotectin assessment.  If her fecal calprotectin remains elevated can consider a course of budesonide .  If fecal calprotectin is unremarkable treatment can be targeted towards management of functional bowel symptoms with optimization of hyoscyamine  and consideration of a probiotic  Plan  Fecal calprotectin assessment today Continue Lialda  2.4 g daily Pending fecal calprotectin result further recommendations can be made regarding next steps in management Continue omeprazole  20 mg orally daily, antireflux modalities Monitor weight anthropometrics  IBD  Health Maintenance  Vaccinations Influenza: PCV13: PPSV23: COVID19: HAV/HBV: Shingles: HPV:  DEXA Date:                            Result:  Pap Smear   Eye Exam   Skin Exam   Surveillance Colonoscopy Ceased due to age  Tobacco Use   Depression Screen     Planned Follow Up Return in about 3 months (around 07/19/2023).  The patient or caregiver verbalized understanding of the material covered, with no barriers to understanding. All questions were answered. Patient or caregiver is agreeable with the plan outlined above.    Sandra was a pleasure to  see Trenia Yang.  If you have any questions or concerns regarding this evaluation, do not hesitate to contact me.  Eugenia Hess, MD [facility]   I spent total of 30 minutes in both face-to-face and non-face-to-face activities, excluding procedures performed, for the visit on the date of this encounter.

## 2023-04-26 ENCOUNTER — Other Ambulatory Visit: Payer: Medicare HMO

## 2023-04-26 DIAGNOSIS — K50118 Crohn's disease of large intestine with other complication: Secondary | ICD-10-CM | POA: Diagnosis not present

## 2023-04-26 DIAGNOSIS — K219 Gastro-esophageal reflux disease without esophagitis: Secondary | ICD-10-CM

## 2023-04-28 DIAGNOSIS — J329 Chronic sinusitis, unspecified: Secondary | ICD-10-CM | POA: Diagnosis not present

## 2023-04-28 LAB — CALPROTECTIN, FECAL: Calprotectin, Fecal: 121 ug/g — ABNORMAL HIGH (ref 0–120)

## 2023-04-29 ENCOUNTER — Other Ambulatory Visit: Payer: Self-pay | Admitting: *Deleted

## 2023-04-29 ENCOUNTER — Encounter: Payer: Self-pay | Admitting: Pediatrics

## 2023-04-29 DIAGNOSIS — K50118 Crohn's disease of large intestine with other complication: Secondary | ICD-10-CM

## 2023-04-29 MED ORDER — BUDESONIDE 3 MG PO CPEP: ORAL_CAPSULE | ORAL | 0 refills | Status: AC

## 2023-04-29 NOTE — Addendum Note (Signed)
Addended by: Richardson Chiquito on: 04/29/2023 08:37 AM   Modules accepted: Orders

## 2023-05-05 DIAGNOSIS — E785 Hyperlipidemia, unspecified: Secondary | ICD-10-CM | POA: Diagnosis not present

## 2023-05-05 DIAGNOSIS — N1832 Chronic kidney disease, stage 3b: Secondary | ICD-10-CM | POA: Diagnosis not present

## 2023-05-05 DIAGNOSIS — I129 Hypertensive chronic kidney disease with stage 1 through stage 4 chronic kidney disease, or unspecified chronic kidney disease: Secondary | ICD-10-CM | POA: Diagnosis not present

## 2023-05-12 DIAGNOSIS — R82998 Other abnormal findings in urine: Secondary | ICD-10-CM | POA: Diagnosis not present

## 2023-05-12 DIAGNOSIS — H9193 Unspecified hearing loss, bilateral: Secondary | ICD-10-CM | POA: Diagnosis not present

## 2023-05-12 DIAGNOSIS — K2100 Gastro-esophageal reflux disease with esophagitis, without bleeding: Secondary | ICD-10-CM | POA: Diagnosis not present

## 2023-05-12 DIAGNOSIS — K5010 Crohn's disease of large intestine without complications: Secondary | ICD-10-CM | POA: Diagnosis not present

## 2023-05-12 DIAGNOSIS — Z0000 Encounter for general adult medical examination without abnormal findings: Secondary | ICD-10-CM | POA: Diagnosis not present

## 2023-05-12 DIAGNOSIS — I10 Essential (primary) hypertension: Secondary | ICD-10-CM | POA: Diagnosis not present

## 2023-05-12 DIAGNOSIS — Z23 Encounter for immunization: Secondary | ICD-10-CM | POA: Diagnosis not present

## 2023-05-16 DIAGNOSIS — E782 Mixed hyperlipidemia: Secondary | ICD-10-CM | POA: Diagnosis not present

## 2023-05-16 DIAGNOSIS — K219 Gastro-esophageal reflux disease without esophagitis: Secondary | ICD-10-CM | POA: Diagnosis not present

## 2023-05-16 DIAGNOSIS — I10 Essential (primary) hypertension: Secondary | ICD-10-CM | POA: Diagnosis not present

## 2023-05-16 DIAGNOSIS — K5090 Crohn's disease, unspecified, without complications: Secondary | ICD-10-CM | POA: Diagnosis not present

## 2023-05-18 ENCOUNTER — Telehealth: Payer: Self-pay | Admitting: Pediatrics

## 2023-05-18 NOTE — Telephone Encounter (Signed)
Left message for patient to call back

## 2023-05-18 NOTE — Telephone Encounter (Signed)
Inbound call from patient stating that since she has been taking Budesonide she has developed high blood pressure. Patient is requesting a call back to discuss. Please advise.

## 2023-05-18 NOTE — Telephone Encounter (Signed)
Spoke to patient who says she is concerned that since she started taking budesonide capsules, she has had very high blood pressure. She states that her recent systolic numbers have been as high as 203 and diastolics in the 90s over the last week. We discussed that budesonide capsules typically do not have the same systemic effects of a normal steroid due to where the medication is metabolized and absorbed. She states that she was placed on amlodipine 2.5 mg by her PCP on 05/12/23 and was bumped up to amlodipine 10 mg by Dr Sharyn Lull a couple days ago. States her BP today is back down to systolics in lower 160s and notes that this is because she did not take budesonide today.   Dr Doy Hutching- Please advise with your thoughts/recommendations regarding budesonide an correlation with hypertension.

## 2023-05-18 NOTE — Telephone Encounter (Signed)
Patient is returning your call stating she is at home now

## 2023-05-19 NOTE — Telephone Encounter (Signed)
Patient advised of recommendations as per Dr Doy Hutching. She verbalizes understanding and states she will call back if having any continuing issues following reduction of budesonide dosing and addition of probiotics

## 2023-05-19 NOTE — Telephone Encounter (Signed)
Discussed Dr Terie Purser response with the patient. She says that her diarrhea has definitely gotten better and she has been having more normal stools. Did have 1 episode of "loose stool" yesterday (did not take budesonide yesterday). Stool frequency has decreased as well. Patient does still c/o large amounts of gas with small amounts of stool leakage when passing it.

## 2023-05-30 ENCOUNTER — Emergency Department (HOSPITAL_BASED_OUTPATIENT_CLINIC_OR_DEPARTMENT_OTHER)
Admission: EM | Admit: 2023-05-30 | Discharge: 2023-05-30 | Disposition: A | Discharge status: Home / Self Care | Payer: Medicare HMO | Attending: Emergency Medicine | Admitting: Emergency Medicine

## 2023-05-30 ENCOUNTER — Encounter (HOSPITAL_BASED_OUTPATIENT_CLINIC_OR_DEPARTMENT_OTHER): Payer: Self-pay | Admitting: *Deleted

## 2023-05-30 ENCOUNTER — Emergency Department (HOSPITAL_BASED_OUTPATIENT_CLINIC_OR_DEPARTMENT_OTHER): Payer: Medicare HMO

## 2023-05-30 ENCOUNTER — Other Ambulatory Visit: Payer: Self-pay

## 2023-05-30 DIAGNOSIS — S0081XA Abrasion of other part of head, initial encounter: Secondary | ICD-10-CM | POA: Diagnosis present

## 2023-05-30 DIAGNOSIS — W108XXA Fall (on) (from) other stairs and steps, initial encounter: Secondary | ICD-10-CM | POA: Diagnosis not present

## 2023-05-30 DIAGNOSIS — S199XXA Unspecified injury of neck, initial encounter: Secondary | ICD-10-CM | POA: Diagnosis not present

## 2023-05-30 DIAGNOSIS — G319 Degenerative disease of nervous system, unspecified: Secondary | ICD-10-CM | POA: Diagnosis not present

## 2023-05-30 DIAGNOSIS — S0003XA Contusion of scalp, initial encounter: Secondary | ICD-10-CM | POA: Insufficient documentation

## 2023-05-30 DIAGNOSIS — Z7982 Long term (current) use of aspirin: Secondary | ICD-10-CM | POA: Diagnosis not present

## 2023-05-30 DIAGNOSIS — T148XXA Other injury of unspecified body region, initial encounter: Secondary | ICD-10-CM

## 2023-05-30 DIAGNOSIS — S0001XA Abrasion of scalp, initial encounter: Secondary | ICD-10-CM | POA: Diagnosis not present

## 2023-05-30 DIAGNOSIS — W19XXXA Unspecified fall, initial encounter: Secondary | ICD-10-CM

## 2023-05-30 DIAGNOSIS — S0990XA Unspecified injury of head, initial encounter: Secondary | ICD-10-CM

## 2023-05-30 MED ORDER — BACITRACIN ZINC 500 UNIT/GM EX OINT
TOPICAL_OINTMENT | CUTANEOUS | Status: AC
  Administered 2023-05-30: 1
  Filled 2023-05-30: qty 28.35

## 2023-05-30 NOTE — Discharge Instructions (Addendum)
 Your head CT and neck CT are reassuring, there is no evidence of any kind of head bleed, or fracture.  Please make sure you are applying.  Bacitracin, to your forehead wound, and return to the ER if you have any redness, swelling, or warmth.  Return to the ER also if you have increased confusion, dizziness, or weakness.

## 2023-05-30 NOTE — ED Triage Notes (Signed)
 Pt is here for fall today.  She states that she tripped and fell and struck her forehead on brick steps.  No LOC.  No blood thinners.

## 2023-05-30 NOTE — ED Provider Notes (Signed)
 [facility] AT [facility] Provider Note   CSN: [id number] Arrival date & time: 05/30/23  1402     History  Chief Complaint  Patient presents with   Sandra Yang is a 89 y.o. female, history of Crohn's colitis, GERD, who presents to the ED secondary to a fall that occurred couple hours ago.  She states she was walking on the stairs outside, and wearing shoes she should not been, when she slipped, and fell and hit her right temple, on a brick.  She denies any loss of consciousness, use of blood thinners.  Denies any shortness of breath, or dizziness prior to the fall.  Reports that she has been feeling well of lately, and has had good p.o. intake.  Last tetanus 3 months ago  Home Medications Prior to Admission medications   Medication Sig Start Date End Date Taking? Authorizing Provider  aspirin 81 MG tablet Take 81 mg by mouth daily.      [provider]  budesonide (ENTOCORT EC) 3 MG 24 hr capsule Take 3 capsules (9 mg total) by mouth daily for 30 days, THEN 2 capsules (6 mg total) daily for 30 days, THEN 1 capsule (3 mg total) daily. X 30 days, then discontinue.. 04/29/23 07/28/23  Ottie Glazier, MD  mesalamine (LIALDA) 1.2 g EC tablet Take 2 tablets (2.4 g total) by mouth in the morning and at bedtime. 04/20/23 07/19/23  Ottie Glazier, MD  metoprolol succinate (TOPROL-XL) 25 MG 24 hr tablet Take 25 mg by mouth daily.    [provider]  MULTIPLE VITAMIN PO Take 1 tablet by mouth daily.    [provider]  omeprazole (PRILOSEC) 20 MG capsule TAKE ONE CAPSULE BY MOUTH ONE TIME DAILY 03/31/23   Meryl Dare, MD  triamterene-hydrochlorothiazide (MAXZIDE-25) 37.5-25 MG per tablet Take 1 tablet by mouth daily.  11/09/11   [provider]      Allergies    Atorvastatin, Doxycycline, Erythromycin, Tetanus toxoids, and Tape    Review of Systems   Review of Systems  Respiratory:  Negative for shortness of  breath.   Cardiovascular:  Negative for chest pain.  Skin:  Positive for wound.    Physical Exam Updated Vital Signs BP (!) 154/67 (BP Location: Right Arm)   Pulse 66   Temp (!) 97.5 F (36.4 C) (Oral)   Resp 15   SpO2 99%  Physical Exam Vitals and nursing note reviewed.  Constitutional:      General: She is not in acute distress.    Appearance: She is well-developed.  HENT:     Head: Normocephalic and atraumatic.     Right Ear: Tympanic membrane normal.     Left Ear: Tympanic membrane normal.     Nose: No nasal deformity or nasal tenderness.     Right Nostril: No septal hematoma.     Left Nostril: No septal hematoma.     Mouth/Throat:     Mouth: Mucous membranes are moist. No lacerations.  Eyes:     Conjunctiva/sclera: Conjunctivae normal.  Cardiovascular:     Rate and Rhythm: Normal rate and regular rhythm.     Heart sounds: No murmur heard. Pulmonary:     Effort: Pulmonary effort is normal. No respiratory distress.     Breath sounds: Normal breath sounds.  Abdominal:     Palpations: Abdomen is soft.     Tenderness: There is no abdominal tenderness.  Musculoskeletal:  General: No swelling.     Cervical back: Neck supple.     Comments: No cervical, thoracic or lumbar ttp. No ttp of extremities.   Skin:    General: Skin is warm and dry.     Capillary Refill: Capillary refill takes less than 2 seconds.     Comments: +abrasion to R forehead/frontal scalp w/o laceration  Neurological:     Mental Status: She is alert.  Psychiatric:        Mood and Affect: Mood normal.     ED Results / Procedures / Treatments   Labs (all labs ordered are listed, but only abnormal results are displayed) Labs Reviewed - No data to display  EKG None  Radiology CT Head Wo Contrast Result Date: 05/30/2023 CLINICAL DATA:  Head trauma, minor (Age >= 65y); Neck trauma (Age >= 65y). Fall, striking forehead on brick steps. EXAM: CT HEAD WITHOUT CONTRAST CT CERVICAL SPINE WITHOUT  CONTRAST TECHNIQUE: Multidetector CT imaging of the head and cervical spine was performed following the standard protocol without intravenous contrast. Multiplanar CT image reconstructions of the cervical spine were also generated. RADIATION DOSE REDUCTION: This exam was performed according to the departmental dose-optimization program which includes automated exposure control, adjustment of the mA and/or kV according to patient size and/or use of iterative reconstruction technique. COMPARISON:  CT head 08/01/2020 FINDINGS: CT HEAD FINDINGS Brain: There is no evidence of an acute infarct, intracranial hemorrhage, mass, midline shift, or definite extra-axial fluid collection. There is mild cerebral atrophy. Mildly prominent extra-axial CSF spaces over the frontal convexities are unchanged and without significant mass effect and are favored to be secondary to volume loss rather than indicative of subdural hygromas. Vascular: Calcified atherosclerosis at the skull base. No hyperdense vessel. Skull: No acute fracture or suspicious lesion. Sinuses/Orbits: Partially visualized sequelae of functional endoscopic sinus surgery. New complete opacification of the left frontal sinus and majority of the left ethmoid sinus/cavity with milder mucosal thickening on the right. New moderate left mastoid effusion. Bilateral cataract extraction. Other: Right frontal scalp contusion. CT CERVICAL SPINE FINDINGS Alignment: Cervical spine straightening. Grade 1 anterolisthesis of C7 on T1, likely degenerative. Skull base and vertebrae: No acute fracture or suspicious lesion. Soft tissues and spinal canal: No prevertebral fluid or swelling. No visible canal hematoma. Disc levels: Advanced disc degeneration from C3-4 through C6-7. Multilevel facet arthrosis, most advanced on the right at C2-3. Multilevel spinal stenosis, moderate at C4-5. Upper chest: Mild biapical pleuroparenchymal lung scarring. Other: None. IMPRESSION: 1. No evidence of  acute intracranial abnormality or cervical spine fracture. 2. Frontal scalp contusion. Electronically Signed   By: Sebastian Ache M.D.   On: 05/30/2023 16:04   CT Cervical Spine Wo Contrast Result Date: 05/30/2023 CLINICAL DATA:  Head trauma, minor (Age >= 65y); Neck trauma (Age >= 65y). Fall, striking forehead on brick steps. EXAM: CT HEAD WITHOUT CONTRAST CT CERVICAL SPINE WITHOUT CONTRAST TECHNIQUE: Multidetector CT imaging of the head and cervical spine was performed following the standard protocol without intravenous contrast. Multiplanar CT image reconstructions of the cervical spine were also generated. RADIATION DOSE REDUCTION: This exam was performed according to the departmental dose-optimization program which includes automated exposure control, adjustment of the mA and/or kV according to patient size and/or use of iterative reconstruction technique. COMPARISON:  CT head 08/01/2020 FINDINGS: CT HEAD FINDINGS Brain: There is no evidence of an acute infarct, intracranial hemorrhage, mass, midline shift, or definite extra-axial fluid collection. There is mild cerebral atrophy. Mildly prominent extra-axial CSF spaces over  the frontal convexities are unchanged and without significant mass effect and are favored to be secondary to volume loss rather than indicative of subdural hygromas. Vascular: Calcified atherosclerosis at the skull base. No hyperdense vessel. Skull: No acute fracture or suspicious lesion. Sinuses/Orbits: Partially visualized sequelae of functional endoscopic sinus surgery. New complete opacification of the left frontal sinus and majority of the left ethmoid sinus/cavity with milder mucosal thickening on the right. New moderate left mastoid effusion. Bilateral cataract extraction. Other: Right frontal scalp contusion. CT CERVICAL SPINE FINDINGS Alignment: Cervical spine straightening. Grade 1 anterolisthesis of C7 on T1, likely degenerative. Skull base and vertebrae: No acute fracture or  suspicious lesion. Soft tissues and spinal canal: No prevertebral fluid or swelling. No visible canal hematoma. Disc levels: Advanced disc degeneration from C3-4 through C6-7. Multilevel facet arthrosis, most advanced on the right at C2-3. Multilevel spinal stenosis, moderate at C4-5. Upper chest: Mild biapical pleuroparenchymal lung scarring. Other: None. IMPRESSION: 1. No evidence of acute intracranial abnormality or cervical spine fracture. 2. Frontal scalp contusion. Electronically Signed   By: Sebastian Ache M.D.   On: 05/30/2023 16:04    Procedures Procedures    Medications Ordered in ED Medications - No data to display  ED Course/ Medical Decision Making/ A&P                                 Medical Decision Making Patient is an 89 year old female, here for head injury, that occurred today after she tripped on some steps while wearing some different shoes.  She has an abrasion to her left forehead, no tenderness to palpation of her orbits, nose, mouth, or jaw.  No cervical tenderness to palpation however given her age, head injury, we will obtain head CT, neck CT, to rule out any kind of fracture, and bleed.  The fall appears to be mechanical she had no chest pain, shortness of breath, and appears to be a reliable historian.  We will obtain imaging for further evaluation.  Additionally tetanus is up-to-date.  Wound is an abrasion, no evidence of any laceration, will apply back bacitracin, nonstick dressing  Amount and/or Complexity of Data Reviewed Radiology:     Details: CT shows frontal scalp contusion, no evidence of any kind of fracture, or bleed Discussion of management or test interpretation with external provider(s): Discussed with patient, scans are reassuring.  Has up-to-date tetanus.  Wound on scalp, had bacitracin and nonstick applied.  We discussed return precautions, including dizziness, headache, intractable nausea, vomiting, or weakness in one-sided the body.  Return  precautions emphasized, discharged home    Final Clinical Impression(s) / ED Diagnoses Final diagnoses:  Fall, initial encounter  Abrasion  Traumatic injury of head, initial encounter    Rx / DC Orders ED Discharge Orders     None         Mamadou Breon, Harley [place], PA 05/30/23 1647    Rozelle Logan, DO 05/30/23 1703

## 2023-06-03 DIAGNOSIS — H04123 Dry eye syndrome of bilateral lacrimal glands: Secondary | ICD-10-CM | POA: Diagnosis not present

## 2023-06-03 DIAGNOSIS — Z961 Presence of intraocular lens: Secondary | ICD-10-CM | POA: Diagnosis not present

## 2023-06-07 DIAGNOSIS — J329 Chronic sinusitis, unspecified: Secondary | ICD-10-CM | POA: Diagnosis not present

## 2023-06-07 DIAGNOSIS — R448 Other symptoms and signs involving general sensations and perceptions: Secondary | ICD-10-CM | POA: Diagnosis not present

## 2023-06-21 DIAGNOSIS — J329 Chronic sinusitis, unspecified: Secondary | ICD-10-CM | POA: Diagnosis not present

## 2023-06-21 DIAGNOSIS — J342 Deviated nasal septum: Secondary | ICD-10-CM | POA: Diagnosis not present

## 2023-06-21 DIAGNOSIS — R519 Headache, unspecified: Secondary | ICD-10-CM | POA: Diagnosis not present

## 2023-07-25 ENCOUNTER — Telehealth: Payer: Self-pay

## 2023-07-25 NOTE — Telephone Encounter (Signed)
 Called and spoke with patient to remind her that she is due for repeat fecal calprotectin at this time. Patient states that since she has decreased Budesonide  to 3 mg daily she is still having diarrhea. Patient reports that she has continued Hyoscyamine  PRN for abdominal pain but she thinks the pain is from the gas. It was previously recommended that patient try Simethicone  tablets purchased over-the-counter every 4-6 hours; consider starting a probiotic such as Culturelle, Florastor or align. Patient had not tried these recommendations but she will purchase tomorrow. Patient is aware that Dr. Yvone Herd will further advise once we have results from fecal calprotectin. Patient verbalized understanding and had no concerns at the end of the call.

## 2023-07-25 NOTE — Telephone Encounter (Signed)
-----   Message from Nurse Dede Fanny sent at 07/25/2023  9:48 AM EDT -----  ----- Message ----- From: Glennette Lanius, RN Sent: 07/25/2023  12:00 AM EDT To: Glennette Lanius, RN  Needs repeat fecal calprotectin around 07/28/23; dr Yvone Herd pt; orders already in epic, just remind pt. (See lab result notes from 04/26/23 for additional info)

## 2023-07-27 ENCOUNTER — Other Ambulatory Visit

## 2023-07-27 DIAGNOSIS — K50118 Crohn's disease of large intestine with other complication: Secondary | ICD-10-CM

## 2023-07-30 LAB — CALPROTECTIN, FECAL: Calprotectin, Fecal: 102 ug/g (ref 0–120)

## 2023-08-01 ENCOUNTER — Telehealth: Payer: Self-pay | Admitting: Pediatrics

## 2023-08-01 DIAGNOSIS — K50118 Crohn's disease of large intestine with other complication: Secondary | ICD-10-CM

## 2023-08-01 DIAGNOSIS — R1084 Generalized abdominal pain: Secondary | ICD-10-CM

## 2023-08-01 DIAGNOSIS — R197 Diarrhea, unspecified: Secondary | ICD-10-CM

## 2023-08-01 NOTE — Telephone Encounter (Signed)
 CT enterography orders are in epic. Secure staff message sent to radiology scheduling to contact patient to schedule.

## 2023-08-01 NOTE — Telephone Encounter (Signed)
 I spoke on the telephone with Sandra Yang to review the results of her recent fecal calprotectin testing.   02/2023 - fecal calprotectin 150 --> Lialda  increased 2.5 g daily to 4.8g BID 04/2023 - fecal calprotectin 121--> initiate a course of budesonide  9 mg, 6 mg, 3 mg 07/2023 - fecal calprotectin 102  In speaking with Sandra Yang, she reports that her loose bowel movements have improved which she attributes more to changing her diet and eliminating forms of cheese rather than course of budesonide .  She is endorsing intermittent abdominal pain that is unresponsive to hyoscyamine .  States that her symptoms have not changed substantially since course of budesonide .  Discussed that in the setting of her previous right hemicolectomy she could also be at risk of SIBO and bile acid diarrhea.   We discussed performing a CT enterography both to evaluate for active Crohn's disease as well as to assess for other intra-abdominal pathology outside of Crohn's disease that would contribute to her symptoms.  She is amenable to this.  I will ask my office coordinate scheduling the test for her.

## 2023-08-01 NOTE — Addendum Note (Signed)
 Addended by: Moe Graca N on: 08/01/2023 01:32 PM   Modules accepted: Orders

## 2023-08-03 NOTE — Telephone Encounter (Signed)
 CT enterography scheduled for 5/625 at 5:30 pm.

## 2023-08-09 ENCOUNTER — Ambulatory Visit (HOSPITAL_BASED_OUTPATIENT_CLINIC_OR_DEPARTMENT_OTHER)
Admission: RE | Admit: 2023-08-09 | Discharge: 2023-08-09 | Disposition: A | Discharge status: Home / Self Care | Source: Ambulatory Visit | Attending: Pediatrics | Admitting: Pediatrics

## 2023-08-09 DIAGNOSIS — K50118 Crohn's disease of large intestine with other complication: Secondary | ICD-10-CM | POA: Diagnosis not present

## 2023-08-09 DIAGNOSIS — Z9049 Acquired absence of other specified parts of digestive tract: Secondary | ICD-10-CM | POA: Diagnosis not present

## 2023-08-09 DIAGNOSIS — Z90710 Acquired absence of both cervix and uterus: Secondary | ICD-10-CM | POA: Diagnosis not present

## 2023-08-09 DIAGNOSIS — R1084 Generalized abdominal pain: Secondary | ICD-10-CM | POA: Insufficient documentation

## 2023-08-09 DIAGNOSIS — R109 Unspecified abdominal pain: Secondary | ICD-10-CM | POA: Diagnosis not present

## 2023-08-09 DIAGNOSIS — R197 Diarrhea, unspecified: Secondary | ICD-10-CM | POA: Diagnosis not present

## 2023-08-09 LAB — POCT I-STAT CREATININE: Creatinine, Ser: 1 mg/dL (ref 0.44–1.00)

## 2023-08-09 MED ORDER — IOHEXOL 300 MG/ML  SOLN
100.0000 mL | Freq: Once | INTRAMUSCULAR | Status: AC | PRN
  Administered 2023-08-09: 85 mL via INTRAVENOUS

## 2023-08-10 ENCOUNTER — Encounter: Payer: Self-pay | Admitting: Pediatrics

## 2023-08-10 DIAGNOSIS — Q828 Other specified congenital malformations of skin: Secondary | ICD-10-CM | POA: Diagnosis not present

## 2023-08-11 ENCOUNTER — Other Ambulatory Visit: Payer: Self-pay | Admitting: Pediatrics

## 2023-08-11 ENCOUNTER — Telehealth: Payer: Self-pay | Admitting: Pediatrics

## 2023-08-11 MED ORDER — CHOLESTYRAMINE 4 G PO PACK: 4.0000 g | PACK | Freq: Two times a day (BID) | ORAL | 3 refills | Status: DC

## 2023-08-11 NOTE — Telephone Encounter (Signed)
 I spoke on the telephone with Sandra Yang to review the results of her CT enterography.  There was no evidence of active inflammation related to her Crohn's disease.  No other abnormalities.  She informs me that she has completed budesonide  and is starting to feel better -has not had loose stool for the last 2 days.  We discussed whether or not her symptoms were truly due to active Crohn's disease or another etiology.  With her history of ileocecal resection she is at risk of bile acid diarrhea and SIBO.  She is interested in a trial of a bile acid sequestrant.  I will recommend a trial of cholestyramine 2 g p.o. twice daily.  Will coordinate a 2 to 77-month follow-up clinic visit

## 2023-08-15 ENCOUNTER — Telehealth: Payer: Self-pay

## 2023-08-15 DIAGNOSIS — K5090 Crohn's disease, unspecified, without complications: Secondary | ICD-10-CM | POA: Diagnosis not present

## 2023-08-15 DIAGNOSIS — I10 Essential (primary) hypertension: Secondary | ICD-10-CM | POA: Diagnosis not present

## 2023-08-15 DIAGNOSIS — K219 Gastro-esophageal reflux disease without esophagitis: Secondary | ICD-10-CM | POA: Diagnosis not present

## 2023-08-15 DIAGNOSIS — E782 Mixed hyperlipidemia: Secondary | ICD-10-CM | POA: Diagnosis not present

## 2023-08-15 NOTE — Telephone Encounter (Signed)
 Appt scheduled 7/11 @ 1:30 PM.

## 2023-08-15 NOTE — Telephone Encounter (Signed)
-----   Message from Truddie Furrow sent at 08/11/2023 12:29 PM EDT ----- Regarding: Return Clinic Visit Hi Mehar Sagen -  On the telephone with Ms. Mcclune today to review her recent CT result.  She is in need of a 2 to 83-month follow-up clinic visit with me.  Can you please assist with coordinating this?  She does not have a MyChart account and request that people call her via telephone to set up appointments.  Thanks,  Haskell Linker

## 2023-08-15 NOTE — Telephone Encounter (Signed)
 I spoke to Sandra Yang and I scheduled her for a FOV on 7/11 @ 1:30pm.

## 2023-09-14 ENCOUNTER — Telehealth: Payer: Self-pay | Admitting: Pediatrics

## 2023-09-14 DIAGNOSIS — J321 Chronic frontal sinusitis: Secondary | ICD-10-CM | POA: Diagnosis not present

## 2023-09-14 NOTE — Telephone Encounter (Signed)
 PT came in the office says she is feeling worse and wants to get seen sooner than 10-14-23. Please advise. Ok to call daughter at [phone]. She is on the [facility].

## 2023-09-14 NOTE — Telephone Encounter (Signed)
 Called and spoke with patient. Patient states that there was miscommunication of her appointment. Pt thought her original appt was today, but it was for 10/14/23. Follow up has been rescheduled to 09/28/23 at 11:10 am with Dr.McGreal. Patient verbalized understanding and had no concerns at the end of the call.

## 2023-09-15 DIAGNOSIS — R300 Dysuria: Secondary | ICD-10-CM | POA: Diagnosis not present

## 2023-09-15 DIAGNOSIS — N390 Urinary tract infection, site not specified: Secondary | ICD-10-CM | POA: Diagnosis not present

## 2023-09-27 NOTE — Progress Notes (Unsigned)
 [facility] Return Visit   Referring Provider Yolande Toribio MATSU, MD [address] [place],  [place] [address]  Primary Care Provider Yolande Toribio MATSU, MD  Patient Profile: Sandra Yang is a 89 y.o. female with a past medical history noteworthy for hyperlipidemia and hypertension who returns to the [facility] for follow-up of the problem(s) noted below.  Problem List: Non stricturing, nonpenetrating Crohn's colitis diagnosed 2007 Diarrhea, fecal urgency and incontinence Personal history of a tubulovillous adenomatous colon polyp - S/P right hemicolectomy 2001 GERD  History of Present Illness   Ms. Affinito was last seen in the GI office 04/20/2023   Current GI Meds  Lialda  2.4 g p.o. twice daily Hyoscyamine  0.125 mg sublingual every 4 hours as needed for abdominal pain Omeprazole  20 mg orally daily  Interval History   History obtained at the time of [name]'s last visit includes the following: At [facility] last visit 02/2023 she endorsed increasing diarrhea for a few months Laboratory studies including CBC, CMP, TSH, ESR, CRP with normal Fecal calprotectin elevated at 150; GI stool profile negative, fecal last 2 days 590 Given elevated fecal calprotectin, she was advised to increase Lialda  from 2.4 g daily to 2.4 g po BID  Hellena reports that dose optimization of her Lialda  has yielded improvement in her symptoms but incomplete resolution of her diarrhea States that on good days she will have 2 formed bowel movements a day without any blood or mucus On bad days she will sometimes have up to 4 loose, watery bowel movements clustered in the morning or after meals She endorses abdominal cramping and bloating with diarrhea Notes that the use of hyoscyamine  has ameliorated bloating and she will take 2 pills some days Has had rare episodes of incontinence No nocturnal bowel movements Endorses spicy food as being a trigger for some of  her symptoms  Denies unexplained fevers, chills, joint pain, skin rashes or oral ulcers  Weight, energy and appetite are stable  GERD symptoms are currently well-controlled on omeprazole  20 mg orally daily Denies pyrosis, regurgitation, dysphagia or odynophagia  Interval History: Fecal calprotectin checked at the time of Naysha's last visit was elevated at 121 She was prescribed a course of budesonide  9 mg, 6 mg, 3 mg Repeat fecal calprotectin 07/2023 decreased to 102  In April 2025 she reported that her loose bowel movements improved that she attributed to limiting forms of cheese in her diet Endorsed abdominal pain unresponsive to hyoscyamine  CTE 08/2023 did not show any evidence of inflammation Initiated trial of cholestyramine  2 g p.o. twice daily 08/2023 for possible bile acid diarrhea secondary to ileocecectomy  Last colonoscopy: 03/2015 -no active inflammation, 4 sessile polyps in the descending colon - 3 TAS + benign mucosa Last endoscopy: 09/2003 - normal  Last Abd CT/CTE/MRE: CTAP 04/2017 -no acute abnormality; post partial colonic resection admitted she had difficulty towards the left and small bowel towards the right, pelvic floor laxity with mild prolapse of bladder and rectum, mild fatty infiltration of liver  GI Review of Symptoms Significant for diarrhea, fecal urgency, rare incontinence. Otherwise negative.  General Review of Systems  Review of systems is significant for the pertinent positives and negatives as listed per the HPI.  Full ROS is otherwise negative.   Inflammatory Bowel Disease History  2007 -diagnosed with Crohn's colitis managed with oral mesalamine   IBD Medication History Oral mesalamine   Past Medical History   Past Medical History:  Diagnosis Date   Allergy    Crohn's colitis (  HCC) 10/2008   GERD (gastroesophageal reflux disease)    Hypertension    Tubulovillous adenoma polyp of colon 03/1999      Past Surgical History   Past Surgical  History:  Procedure Laterality Date   APPENDECTOMY     BREAST EXCISIONAL BIOPSY Right 2019   CHOLECYSTECTOMY  2001   FOOT SURGERY Left    HAND SURGERY Left    HEMICOLECTOMY  2001   RADIOACTIVE SEED GUIDED EXCISIONAL BREAST BIOPSY Right 04/13/2017   Procedure: RIGHT RADIOACTIVE SEED GUIDED EXCISIONAL BREAST BIOPSY ERAS PATHWAY;  Surgeon: Ebbie Cough, MD;  Location: [facility];  Service: General;  Laterality: Right;  LMA   VAGINAL HYSTERECTOMY     total      Allergies and Medications   Allergies  Allergen Reactions   Atorvastatin Other (See Comments)   Doxycycline Hives   Erythromycin Itching   Tetanus Toxoids Swelling    Swelling of arm   Tape Rash    No outpatient medications have been marked as taking for the 09/28/23 encounter (Appointment) with Suzann Inocente HERO, MD.     Family History   Family History  Problem Relation Age of Onset   Stroke Mother    Prostate cancer Father    Bone cancer Father    Breast cancer Sister    Colon cancer Neg Hx    Stomach cancer Neg Hx     Social History   Social History   Social History Narrative   [name] is widowed   She has children who reside in the local area   She is a previous smoker and quit in 1980   Social alcohol  consumption   Employed at [employer] and continue to work 1 day a week   Tanis reports that she quit smoking about 43 years ago. Her smoking use included cigarettes. She has never used smokeless tobacco. She reports current alcohol  use of about 2.0 standard drinks of alcohol  per week. She reports that she does not use drugs.  Vital Signs and Physical Examination   There were no vitals filed for this visit.  There is no height or weight on file to calculate BMI.    Constitutional: healthy appearing nontoxic appearing 89 y.o. female in no apparent distress.  Appears younger than stated age Eyes: Clear sclerae Head: Normocephalic/atraumatic Neck: Normal Pulmonary:  Clear to auscultation bilaterally CV: S1, S2, regular rate and rhythm GI/Abdomen: Soft, nontender, nondistended, normoactive bowel sounds Anorectal: Deferred Neuro: Nonfocal Skin: Warm, dry, no rash Bilateral Extremities: Normal   Review of Data  The following data was reviewed at the time of this encounter:  Laboratory Studies   Lab Results  Component Value Date   WBC 4.8 02/22/2023   HGB 14.0 02/22/2023   HCT 42.1 02/22/2023   MCV 92.7 02/22/2023   PLT 187.0 02/22/2023     Chemistry      Component Value Date/Time   NA 138 02/22/2023 1153   K 4.4 02/22/2023 1153   CL 102 02/22/2023 1153   CO2 28 02/22/2023 1153   BUN 17 02/22/2023 1153   CREATININE 1.00 08/09/2023 1732      Component Value Date/Time   CALCIUM 9.9 02/22/2023 1153   ALKPHOS 64 02/22/2023 1153   AST 19 02/22/2023 1153   ALT 11 02/22/2023 1153   BILITOT 0.5 02/22/2023 1153      Lab Results  Component Value Date   TSH 1.58 02/22/2023   Lab Results  Component Value Date  CRP <1.0 02/22/2023   Lab Results  Component Value Date   ESRSEDRATE 14 02/22/2023   02/2023  GI stool profile negative Fecal elastase 590 Fecal calprotectin 150  07/2023 Fecal calprotectin  102  Imaging Studies  CTE 08/10/2023 No evidence for active or sequelae of chronic inflammatory bowel disease. Prior ileocecectomy.  CTAP 04/2017 No acute abnormality of the abdomen or pelvis.   Post partial colonic resection with mild shift of colon towards the left and small bowel towards the right.   Pelvic floor laxity with mild prolapse urinary bladder and rectum.   Mild fatty infiltration of the liver.   Liver and renal cysts incidentally noted.   Aortic Atherosclerosis (ICD10-I70.0).   Degenerative changes L2-3 through L5-S1.  CTAP 03/2006 Findings: Mild wall thickening in the entire remaining colon, greatest in the descending/left colon noted. No evidence of free fluid, bowel obstruction, or pneumoperitoneum. The  remainder of the bowel and bladder are unremarkable. Evidence of prior proximal partial colectomy noted. The visualized portions of the celiac artery, SMA, IMA, and SMV are patent.   IMPRESSION:  Colitis, greatest in the left colon - ischemia not excluded.     GI Procedures and Studies  Colonoscopy 03/18/2015 No active inflammation, 4 sessile polyps in the descending colon - 3TAS + benign mucosa  Additional previous endoscopic studies were reviewed in EPIC  Clinical Impression  It is my clinical impression that Ms. Spainhower is a 89 y.o. female with;  Non stricturing, nonpenetrating Crohn's colitis diagnosed 2007 Diarrhea, fecal urgency and incontinence Personal history of a tubulovillous adenomatous colon polyp - S/P right hemicolectomy 2001 GERD  Janda was diagnosed with stricturing, non penetrating Crohn's colitis in 2007.  She has primarily been managed with oral mesalamine .  At the time of her last clinic visit 02/2023 she endorsed increasing symptoms of diarrhea.  Laboratory testing at that time was unremarkable with the exception of elevated fecal calprotectin 150.  Stool pathogen panel was negative.  She was advised to increase Lialda  from 2.4 g daily to 2.4 g p.o. twice daily.  She reports that this resulted in improvement in her symptoms.  Today, Joselin reports that her diarrhea and bloating better than in the fall but she continues to experience intermittent loose stool, fecal urgency and rare episodes of incontinence.  These episodes of loose stool are punctuated by days of normal bowel movements.  We discussed that it is unclear if there is truly ongoing active inflammation or if there may be another process contributing to her symptoms.  As such, I have suggested repeat fecal calprotectin assessment.  If her fecal calprotectin remains elevated can consider a course of budesonide .  If fecal calprotectin is unremarkable treatment can be targeted towards management of functional bowel  symptoms with optimization of hyoscyamine  and consideration of a probiotic  Plan  Fecal calprotectin assessment today Continue Lialda  2.4 g daily Pending fecal calprotectin result further recommendations can be made regarding next steps in management Continue omeprazole  20 mg orally daily, antireflux modalities Monitor weight anthropometrics  IBD Health Maintenance  Vaccinations Influenza: PCV13: PPSV23: COVID19: HAV/HBV: Shingles: HPV:  DEXA Date:                            Result:  Pap Smear   Eye Exam   Skin Exam   Surveillance Colonoscopy Ceased due to age  Tobacco Use   Depression Screen     Planned Follow Up No follow-ups on file.  The patient or caregiver verbalized understanding of the material covered, with no barriers to understanding. All questions were answered. Patient or caregiver is agreeable with the plan outlined above.    It was a pleasure to see Olivia.  If you have any questions or concerns regarding this evaluation, do not hesitate to contact me.  Inocente Hausen, MD [facility]   I spent total of 30 minutes in both face-to-face and non-face-to-face activities, excluding procedures performed, for the visit on the date of this encounter.

## 2023-09-28 ENCOUNTER — Telehealth: Payer: Self-pay | Admitting: Pediatrics

## 2023-09-28 ENCOUNTER — Other Ambulatory Visit (INDEPENDENT_AMBULATORY_CARE_PROVIDER_SITE_OTHER)

## 2023-09-28 ENCOUNTER — Ambulatory Visit: Admitting: Pediatrics

## 2023-09-28 ENCOUNTER — Encounter: Payer: Self-pay | Admitting: Pediatrics

## 2023-09-28 VITALS — BP 150/60 | HR 80 | Ht 59.0 in | Wt 117.4 lb

## 2023-09-28 DIAGNOSIS — R140 Abdominal distension (gaseous): Secondary | ICD-10-CM

## 2023-09-28 DIAGNOSIS — R5383 Other fatigue: Secondary | ICD-10-CM

## 2023-09-28 DIAGNOSIS — R143 Flatulence: Secondary | ICD-10-CM | POA: Diagnosis not present

## 2023-09-28 DIAGNOSIS — K50119 Crohn's disease of large intestine with unspecified complications: Secondary | ICD-10-CM

## 2023-09-28 DIAGNOSIS — R141 Gas pain: Secondary | ICD-10-CM

## 2023-09-28 DIAGNOSIS — K5900 Constipation, unspecified: Secondary | ICD-10-CM | POA: Diagnosis not present

## 2023-09-28 DIAGNOSIS — K219 Gastro-esophageal reflux disease without esophagitis: Secondary | ICD-10-CM

## 2023-09-28 DIAGNOSIS — Z860101 Personal history of adenomatous and serrated colon polyps: Secondary | ICD-10-CM

## 2023-09-28 LAB — COMPREHENSIVE METABOLIC PANEL WITH GFR
ALT: 11 U/L (ref 0–35)
AST: 15 U/L (ref 0–37)
Albumin: 4.3 g/dL (ref 3.5–5.2)
Alkaline Phosphatase: 55 U/L (ref 39–117)
BUN: 13 mg/dL (ref 6–23)
CO2: 28 meq/L (ref 19–32)
Calcium: 9.9 mg/dL (ref 8.4–10.5)
Chloride: 101 meq/L (ref 96–112)
Creatinine, Ser: 0.88 mg/dL (ref 0.40–1.20)
GFR: 58.73 mL/min — ABNORMAL LOW (ref >60.00)
Glucose, Bld: 90 mg/dL (ref 70–99)
Potassium: 4 meq/L (ref 3.5–5.1)
Sodium: 137 meq/L (ref 135–145)
Total Bilirubin: 0.6 mg/dL (ref 0.2–1.2)
Total Protein: 6.8 g/dL (ref 6.0–8.3)

## 2023-09-28 LAB — CBC WITH DIFFERENTIAL/PLATELET
Basophils Absolute: 0 10*3/uL (ref 0.0–0.1)
Basophils Relative: 0.6 % (ref 0.0–3.0)
Eosinophils Absolute: 0 10*3/uL (ref 0.0–0.7)
Eosinophils Relative: 0.8 % (ref 0.0–5.0)
HCT: 39.8 % (ref 36.0–46.0)
Hemoglobin: 13.7 g/dL (ref 12.0–15.0)
Lymphocytes Relative: 17.5 % (ref 12.0–46.0)
Lymphs Abs: 0.8 10*3/uL (ref 0.7–4.0)
MCHC: 34.3 g/dL (ref 30.0–36.0)
MCV: 92.2 fl (ref 78.0–100.0)
Monocytes Absolute: 0.3 10*3/uL (ref 0.1–1.0)
Monocytes Relative: 6.5 % (ref 3.0–12.0)
Neutro Abs: 3.6 10*3/uL (ref 1.4–7.7)
Neutrophils Relative %: 74.6 % (ref 43.0–77.0)
Platelets: 192 10*3/uL (ref 150.0–400.0)
RBC: 4.32 Mil/uL (ref 3.87–5.11)
RDW: 12.9 % (ref 11.5–15.5)
WBC: 4.8 10*3/uL (ref 4.0–10.5)

## 2023-09-28 LAB — IBC + FERRITIN
Ferritin: 23.9 ng/mL (ref 10.0–291.0)
Iron: 163 ug/dL — ABNORMAL HIGH (ref 42–145)
Saturation Ratios: 50.6 % — ABNORMAL HIGH (ref 20.0–50.0)
TIBC: 322 ug/dL (ref 250.0–450.0)
Transferrin: 230 mg/dL (ref 212.0–360.0)

## 2023-09-28 LAB — B12 AND FOLATE PANEL
Folate: 11 ng/mL (ref >5.9)
Vitamin B-12: 212 pg/mL (ref 211–911)

## 2023-09-28 LAB — C-REACTIVE PROTEIN: CRP: <1 mg/dL (ref 0.5–20.0)

## 2023-09-28 LAB — TSH: TSH: 1.14 u[IU]/mL (ref 0.35–5.50)

## 2023-09-28 LAB — SEDIMENTATION RATE: Sed Rate: 6 mm/h (ref 0–30)

## 2023-09-28 MED ORDER — CYANOCOBALAMIN 1000 MCG/ML IJ SOLN: 1000.0000 ug | INTRAMUSCULAR | 3 refills | Status: AC

## 2023-09-28 MED ORDER — RIFAXIMIN 550 MG PO TABS: 550.0000 mg | ORAL_TABLET | Freq: Three times a day (TID) | ORAL | 0 refills | Status: DC

## 2023-09-28 NOTE — Patient Instructions (Signed)
 Your provider has requested that you go to the basement level for lab work before leaving today. Press B on the elevator. The lab is located at the first door on the left as you exit the elevator.  Due to recent changes in healthcare laws, you may see the results of your imaging and laboratory studies on MyChart before your provider has had a chance to review them.  We understand that in some cases there may be results that are confusing or concerning to you. Not all laboratory results come back in the same time frame and the provider may be waiting for multiple results in order to interpret others.  Please give us  48 hours in order for your provider to thoroughly review all the results before contacting the office for clarification of your results.   We have sent the following medications to your pharmacy for you to pick up at your convenience: Xifaxin 550 mg, take 1 tablet 3 times a day for 14 days.  Thank you for entrusting me with your care and for choosing [facility], Dr. Inocente Hausen   _______________________________________________________  If your blood pressure at your visit was 140/90 or greater, please contact your primary care physician to follow up on this.  _______________________________________________________  If you are age 46 or older, your body mass index should be between 23-30. Your Body mass index is 23.71 kg/m. If this is out of the aforementioned range listed, please consider follow up with your Primary Care Provider.  If you are age 25 or younger, your body mass index should be between 19-25. Your Body mass index is 23.71 kg/m. If this is out of the aformentioned range listed, please consider follow up with your Primary Care Provider.   ________________________________________________________  The [facility] providers would like to encourage you to use MYCHART to communicate with providers for non-urgent requests or questions.  Due to long hold times on  the telephone, sending your provider a message by [facility] may be a faster and more efficient way to get a response.  Please allow 48 business hours for a response.  Please remember that this is for non-urgent requests.  _______________________________________________________

## 2023-09-28 NOTE — Telephone Encounter (Signed)
 I spoke on the telephone with Sandra Yang to review her  laboratory results from today.  CBC, metabolic panel, inflammatory markers and thyroid  are normal.  Provide the B12 level is at the low end of normal which could reflect some degree of deficiency from her previous surgical resection.  I have recommended starting on vitamin B12 injections 1000 mcg IM every 2 weeks.  She informs me that her daughter is a Engineer, civil (consulting) to perform these for her at home.  I will send in a prescription for vitamin B12 with needles and syringes for her to begin injections.  Advised I will update her when her celiac serologies return.

## 2023-09-29 ENCOUNTER — Other Ambulatory Visit (HOSPITAL_COMMUNITY): Payer: Self-pay

## 2023-09-29 ENCOUNTER — Telehealth: Payer: Self-pay

## 2023-09-29 LAB — TISSUE TRANSGLUTAMINASE ABS,IGG,IGA
(tTG) Ab, IgA: <1 U/mL
(tTG) Ab, IgG: <1 U/mL

## 2023-09-29 LAB — IGA: Immunoglobulin A: 159 mg/dL (ref 70–320)

## 2023-09-29 NOTE — Telephone Encounter (Signed)
 Pharmacy Patient Advocate Encounter   Received notification from CoverMyMeds that prior authorization for Xifaxan 550MG  tablets is required/requested.   Insurance verification completed.   The patient is insured through [facility] Medicare.   Per test claim: Please verify diagnosis to use for submission.

## 2023-09-30 NOTE — Telephone Encounter (Signed)
 Pharmacy Patient Advocate Encounter    Per test claim: PA required; PA submitted to above mentioned insurance via CoverMyMeds Key/confirmation #/EOC [id number] Status is pending

## 2023-09-30 NOTE — Telephone Encounter (Signed)
 Pharmacy Patient Advocate Encounter  Received notification from [facility] [employer] that Prior Authorization for Xifaxan  550MG  tablets has been APPROVED from 04-06-2023 to 10-14-2023   PA #/Case ID/Reference #: [id number]

## 2023-10-03 MED ORDER — METRONIDAZOLE 250 MG PO TABS: 250.0000 mg | ORAL_TABLET | Freq: Three times a day (TID) | ORAL | 0 refills | Status: DC

## 2023-10-03 NOTE — Telephone Encounter (Signed)
 I spoke to Sandra Yang and I advised her that the Xifaxin was approved but the pharmacy says that her copay is $700.  I explained that we can make a request for samples but I am not sure how long that will take.  I told her that Dr. Suzann recommended that we try Metronidazole.  She agreed to try the medication just in case the samples take too long.

## 2023-10-03 NOTE — Addendum Note (Signed)
 Addended by: WILLIEMAE JOLA PARAS on: 10/03/2023 04:44 PM   Modules accepted: Orders

## 2023-10-12 ENCOUNTER — Telehealth: Payer: Self-pay | Admitting: Pediatrics

## 2023-10-12 NOTE — Telephone Encounter (Signed)
 The following patient is calling because she has had an allergic reaction to flagyl . She would like to discuss the symptoms with a nurse. Please advise.

## 2023-10-12 NOTE — Telephone Encounter (Signed)
Lm on home vm for patient to return call.  

## 2023-10-13 NOTE — Telephone Encounter (Addendum)
 Called and informed Sonny that Dr. Suzann is in agreeance with discontinuing Flagyl  and monitoring symptoms at this time. Sonny will let us  know if patient has recurrent symptoms and we can discuss alternative treatment at that time. Sonny verbalized understanding and had no concerns at the end of the call.

## 2023-10-13 NOTE — Telephone Encounter (Signed)
 2nd attempt to reach patient - Lm on home vm for patient to return call.

## 2023-10-13 NOTE — Telephone Encounter (Signed)
 Patient's daughter returned call. Sonny reports that patient only completed 2 doses of Flagyl  because she had a reaction to the medication. After taking the Flagyl  patient had itching, hot, and flushed. Sonny requested that flagyl  be added to patient's allergy list. Diarrhea is doing better at this time. Due to the medication reaction patient just wishes to monitor symptoms at this time, they wanted to make sure this is OK with you.

## 2023-10-14 ENCOUNTER — Ambulatory Visit: Admitting: Pediatrics

## 2023-10-14 DIAGNOSIS — I10 Essential (primary) hypertension: Secondary | ICD-10-CM | POA: Diagnosis not present

## 2023-10-14 DIAGNOSIS — K219 Gastro-esophageal reflux disease without esophagitis: Secondary | ICD-10-CM | POA: Diagnosis not present

## 2023-10-14 DIAGNOSIS — K5090 Crohn's disease, unspecified, without complications: Secondary | ICD-10-CM | POA: Diagnosis not present

## 2023-10-14 DIAGNOSIS — E782 Mixed hyperlipidemia: Secondary | ICD-10-CM | POA: Diagnosis not present

## 2023-10-27 DIAGNOSIS — Z008 Encounter for other general examination: Secondary | ICD-10-CM | POA: Diagnosis not present

## 2023-10-29 ENCOUNTER — Emergency Department (HOSPITAL_BASED_OUTPATIENT_CLINIC_OR_DEPARTMENT_OTHER)

## 2023-10-29 ENCOUNTER — Emergency Department (HOSPITAL_BASED_OUTPATIENT_CLINIC_OR_DEPARTMENT_OTHER): Admission: EM | Admit: 2023-10-29 | Discharge: 2023-10-29 | Disposition: A | Discharge status: Home / Self Care

## 2023-10-29 ENCOUNTER — Encounter (HOSPITAL_BASED_OUTPATIENT_CLINIC_OR_DEPARTMENT_OTHER): Payer: Self-pay

## 2023-10-29 ENCOUNTER — Other Ambulatory Visit: Payer: Self-pay

## 2023-10-29 DIAGNOSIS — S199XXA Unspecified injury of neck, initial encounter: Secondary | ICD-10-CM | POA: Diagnosis not present

## 2023-10-29 DIAGNOSIS — S0012XA Contusion of left eyelid and periocular area, initial encounter: Secondary | ICD-10-CM | POA: Insufficient documentation

## 2023-10-29 DIAGNOSIS — Z7982 Long term (current) use of aspirin: Secondary | ICD-10-CM | POA: Insufficient documentation

## 2023-10-29 DIAGNOSIS — Z79899 Other long term (current) drug therapy: Secondary | ICD-10-CM | POA: Diagnosis not present

## 2023-10-29 DIAGNOSIS — I10 Essential (primary) hypertension: Secondary | ICD-10-CM | POA: Insufficient documentation

## 2023-10-29 DIAGNOSIS — W1809XA Striking against other object with subsequent fall, initial encounter: Secondary | ICD-10-CM | POA: Diagnosis not present

## 2023-10-29 DIAGNOSIS — R220 Localized swelling, mass and lump, head: Secondary | ICD-10-CM | POA: Diagnosis not present

## 2023-10-29 DIAGNOSIS — S0990XA Unspecified injury of head, initial encounter: Secondary | ICD-10-CM

## 2023-10-29 DIAGNOSIS — G319 Degenerative disease of nervous system, unspecified: Secondary | ICD-10-CM | POA: Diagnosis not present

## 2023-10-29 NOTE — ED Provider Notes (Signed)
  EMERGENCY DEPARTMENT AT [facility] Provider Note   CSN: [id number] Arrival date & time: 10/29/23  1110     Patient presents with: Sandra Yang is a 89 y.o. female.   48-year-old female with past medical history of hypertension who is not on any blood thinners presenting to the emergency department today to be evaluated after a fall.  The patient had a mechanical fall this morning.  She struck the left side of her face.  She denies any severe headaches.  She has had significant swelling around her eye and she was brought in for further evaluation due to this.  She denies any blurred vision.  Denies any eye pain or diplopia.  She came to the ER after being brought in by her family.  This was a mechanical fall.  She denies any other injuries.  She does have a small abrasion.  The patient did not have a severe allergic reaction to Tdap vaccine and her last was around 10 years ago.   Fall       Prior to Admission medications   Medication Sig Start Date End Date Taking? Authorizing Provider  amLODipine  (NORVASC ) 2.5 MG tablet Take 2.5 mg by mouth daily. Patient not taking: Reported on 09/28/2023 07/26/23   [provider]  aspirin  81 MG tablet Take 81 mg by mouth daily.   Patient not taking: Reported on 09/28/2023    [provider]  cholestyramine  (QUESTRAN ) 4 g packet Take 1 packet (4 g total) by mouth 2 (two) times daily. 08/11/23   Suzann Inocente HERO, MD  cyanocobalamin  (VITAMIN B12) 1000 MCG/ML injection Inject 1 mL (1,000 mcg total) into the muscle every 14 (fourteen) days for 365 doses. 09/28/23 09/11/37  Suzann Inocente HERO, MD  Ergocalciferol  10 MCG (400 UNIT) TABS Take 1 tablet by mouth daily. 10/28/16   [provider]  losartan (COZAAR) 50 MG tablet Take 50 mg by mouth daily. 06/23/23   [provider]  mesalamine  (LIALDA ) 1.2 g EC tablet Take 2 tablets (2.4 g total) by mouth in the morning and at bedtime. 04/20/23 09/28/23   Suzann Inocente HERO, MD  metoprolol  succinate (TOPROL -XL) 25 MG 24 hr tablet Take 25 mg by mouth daily. Patient not taking: Reported on 09/28/2023    [provider]  metroNIDAZOLE  (FLAGYL ) 250 MG tablet Take 1 tablet (250 mg total) by mouth 3 (three) times daily. 10/03/23   Suzann Inocente HERO, MD  MULTIPLE VITAMIN PO Take 1 tablet by mouth daily.    [provider]  omeprazole  (PRILOSEC) 20 MG capsule TAKE ONE CAPSULE BY MOUTH ONE TIME DAILY 03/31/23   Aneita Gwendlyn DASEN, MD  triamterene -hydrochlorothiazide  (MAXZIDE -25) 37.5-25 MG per tablet Take 1 tablet by mouth daily.  Patient not taking: Reported on 09/28/2023 11/09/11   [provider]    Allergies: Metronidazole , Atorvastatin, Azithromycin, Doxycycline, Erythromycin, Raloxifene, Tetanus immune globulin, Tetanus toxoids, Tetanus-diphtheria toxoids td, and Tape    Review of Systems  HENT:         Bruising around left eye  All other systems reviewed and are negative.   Updated Vital Signs BP (!) 179/67   Pulse 93   Temp 98 F (36.7 C)   Resp 14   SpO2 93%   Physical Exam Vitals and nursing note reviewed.   Gen: NAD Eyes: PERRL, EOMI, there is periorbital edema noted around the left eye, no proptosis noted, vision grossly intact and no complaints blurred vision HEENT: no oropharyngeal  swelling Neck: trachea midline, no midline tenderness Resp: clear to auscultation bilaterally Card: RRR, no murmurs, rubs, or gallops Abd: nontender, nondistended Extremities: no calf tenderness, no edema Vascular: 2+ radial pulses bilaterally, 2+ DP pulses bilaterally Neuro: No focal deficits Skin: no rashes Psyc: acting appropriately   (all labs ordered are listed, but only abnormal results are displayed) Labs Reviewed - No data to display  EKG: None  Radiology: CT Head Wo Contrast Result Date: 10/29/2023 CLINICAL DATA:  Head trauma, minor (Age >= 65y); Neck trauma (Age >= 65y); Facial trauma, blunt. Fall with  abrasions to the left face, left eye swelling, and forehead hematoma. EXAM: CT HEAD WITHOUT CONTRAST CT MAXILLOFACIAL WITHOUT CONTRAST CT CERVICAL SPINE WITHOUT CONTRAST TECHNIQUE: Multidetector CT imaging of the head, cervical spine, and maxillofacial structures were performed using the standard protocol without intravenous contrast. Multiplanar CT image reconstructions of the cervical spine and maxillofacial structures were also generated. RADIATION DOSE REDUCTION: This exam was performed according to the departmental dose-optimization program which includes automated exposure control, adjustment of the mA and/or kV according to patient size and/or use of iterative reconstruction technique. COMPARISON:  CT head and cervical spine 05/30/2023. CT maxillofacial 08/01/2020. FINDINGS: CT HEAD FINDINGS Brain: There is no evidence of an acute infarct, intracranial hemorrhage, mass, or midline shift. There is mild cerebral atrophy. Mildly prominent extra-axial CSF spaces over both frontal convexities are unchanged and again favored to be secondary to atrophy rather than small chronic subdural hygromas. The ventricles are normal in size. Vascular: Calcified atherosclerosis at the skull base. No hyperdense vessel. Skull: No acute fracture or suspicious lesion. Other: Left forehead hematoma. CT MAXILLOFACIAL FINDINGS Osseous: No acute fracture, mandibular dislocation, or destructive process. Orbits: Bilateral cataract extraction. Left periorbital hematoma. No retrobulbar hematoma. Sinuses: Prior functional endoscopic sinus surgery. Mild scattered mucosal thickening. Small bilateral mastoid effusions. Clear middle ear cavities. Soft tissues: No other acute finding. CT CERVICAL SPINE FINDINGS Alignment: Chronic straightening of the normal cervical lordosis with unchanged grade 1 anterolisthesis of C7 on T1 and trace retrolisthesis of C3 on C4 and C4 on C5. Skull base and vertebrae: No acute fracture or suspicious lesion. Soft  tissues and spinal canal: No prevertebral fluid or swelling. No visible canal hematoma. Disc levels: Similar appearance of advanced disc degeneration from C3-4 through C6-7 and of facet arthrosis which is particularly severe at C2-3 on the right. Multilevel spinal stenosis, at least moderate at C4-5 and potentially moderate at C3-4 and C6-7 as well. Moderate left neural foraminal stenosis at C3-4. Upper chest: Mild biapical pleuroparenchymal lung scarring. Other: None. IMPRESSION: 1. No evidence of acute intracranial abnormality. 2. No acute maxillofacial or cervical spine fracture. 3. Left periorbital and forehead hematomas. Electronically Signed   By: Dasie Hamburg M.D.   On: 10/29/2023 12:09   CT Maxillofacial Wo Contrast Result Date: 10/29/2023 CLINICAL DATA:  Head trauma, minor (Age >= 65y); Neck trauma (Age >= 65y); Facial trauma, blunt. Fall with abrasions to the left face, left eye swelling, and forehead hematoma. EXAM: CT HEAD WITHOUT CONTRAST CT MAXILLOFACIAL WITHOUT CONTRAST CT CERVICAL SPINE WITHOUT CONTRAST TECHNIQUE: Multidetector CT imaging of the head, cervical spine, and maxillofacial structures were performed using the standard protocol without intravenous contrast. Multiplanar CT image reconstructions of the cervical spine and maxillofacial structures were also generated. RADIATION DOSE REDUCTION: This exam was performed according to the departmental dose-optimization program which includes automated exposure control, adjustment of the mA and/or kV according to patient size and/or use of iterative reconstruction technique. COMPARISON:  CT head and cervical spine 05/30/2023. CT maxillofacial 08/01/2020. FINDINGS: CT HEAD FINDINGS Brain: There is no evidence of an acute infarct, intracranial hemorrhage, mass, or midline shift. There is mild cerebral atrophy. Mildly prominent extra-axial CSF spaces over both frontal convexities are unchanged and again favored to be secondary to atrophy rather than  small chronic subdural hygromas. The ventricles are normal in size. Vascular: Calcified atherosclerosis at the skull base. No hyperdense vessel. Skull: No acute fracture or suspicious lesion. Other: Left forehead hematoma. CT MAXILLOFACIAL FINDINGS Osseous: No acute fracture, mandibular dislocation, or destructive process. Orbits: Bilateral cataract extraction. Left periorbital hematoma. No retrobulbar hematoma. Sinuses: Prior functional endoscopic sinus surgery. Mild scattered mucosal thickening. Small bilateral mastoid effusions. Clear middle ear cavities. Soft tissues: No other acute finding. CT CERVICAL SPINE FINDINGS Alignment: Chronic straightening of the normal cervical lordosis with unchanged grade 1 anterolisthesis of C7 on T1 and trace retrolisthesis of C3 on C4 and C4 on C5. Skull base and vertebrae: No acute fracture or suspicious lesion. Soft tissues and spinal canal: No prevertebral fluid or swelling. No visible canal hematoma. Disc levels: Similar appearance of advanced disc degeneration from C3-4 through C6-7 and of facet arthrosis which is particularly severe at C2-3 on the right. Multilevel spinal stenosis, at least moderate at C4-5 and potentially moderate at C3-4 and C6-7 as well. Moderate left neural foraminal stenosis at C3-4. Upper chest: Mild biapical pleuroparenchymal lung scarring. Other: None. IMPRESSION: 1. No evidence of acute intracranial abnormality. 2. No acute maxillofacial or cervical spine fracture. 3. Left periorbital and forehead hematomas. Electronically Signed   By: Dasie Hamburg M.D.   On: 10/29/2023 12:09   CT Cervical Spine Wo Contrast Result Date: 10/29/2023 CLINICAL DATA:  Head trauma, minor (Age >= 65y); Neck trauma (Age >= 65y); Facial trauma, blunt. Fall with abrasions to the left face, left eye swelling, and forehead hematoma. EXAM: CT HEAD WITHOUT CONTRAST CT MAXILLOFACIAL WITHOUT CONTRAST CT CERVICAL SPINE WITHOUT CONTRAST TECHNIQUE: Multidetector CT imaging of the  head, cervical spine, and maxillofacial structures were performed using the standard protocol without intravenous contrast. Multiplanar CT image reconstructions of the cervical spine and maxillofacial structures were also generated. RADIATION DOSE REDUCTION: This exam was performed according to the departmental dose-optimization program which includes automated exposure control, adjustment of the mA and/or kV according to patient size and/or use of iterative reconstruction technique. COMPARISON:  CT head and cervical spine 05/30/2023. CT maxillofacial 08/01/2020. FINDINGS: CT HEAD FINDINGS Brain: There is no evidence of an acute infarct, intracranial hemorrhage, mass, or midline shift. There is mild cerebral atrophy. Mildly prominent extra-axial CSF spaces over both frontal convexities are unchanged and again favored to be secondary to atrophy rather than small chronic subdural hygromas. The ventricles are normal in size. Vascular: Calcified atherosclerosis at the skull base. No hyperdense vessel. Skull: No acute fracture or suspicious lesion. Other: Left forehead hematoma. CT MAXILLOFACIAL FINDINGS Osseous: No acute fracture, mandibular dislocation, or destructive process. Orbits: Bilateral cataract extraction. Left periorbital hematoma. No retrobulbar hematoma. Sinuses: Prior functional endoscopic sinus surgery. Mild scattered mucosal thickening. Small bilateral mastoid effusions. Clear middle ear cavities. Soft tissues: No other acute finding. CT CERVICAL SPINE FINDINGS Alignment: Chronic straightening of the normal cervical lordosis with unchanged grade 1 anterolisthesis of C7 on T1 and trace retrolisthesis of C3 on C4 and C4 on C5. Skull base and vertebrae: No acute fracture or suspicious lesion. Soft tissues and spinal canal: No prevertebral fluid or swelling. No visible canal hematoma. Disc levels: Similar appearance of  advanced disc degeneration from C3-4 through C6-7 and of facet arthrosis which is  particularly severe at C2-3 on the right. Multilevel spinal stenosis, at least moderate at C4-5 and potentially moderate at C3-4 and C6-7 as well. Moderate left neural foraminal stenosis at C3-4. Upper chest: Mild biapical pleuroparenchymal lung scarring. Other: None. IMPRESSION: 1. No evidence of acute intracranial abnormality. 2. No acute maxillofacial or cervical spine fracture. 3. Left periorbital and forehead hematomas. Electronically Signed   By: Dasie Hamburg M.D.   On: 10/29/2023 12:09     Procedures   Medications Ordered in the ED - No data to display                                  Medical Decision Making 89 year old female with past medical history of hypertension presenting to the emergency department today after a mechanical fall.  I will further evaluate patient here with a CT scan of her head and maxillofacial bones as well as CT scan of her neck for further evaluation for acute traumatic injuries.  She does not appear to have any proptosis or visual complaints at this time as this seems to be more of a periorbital injury.  I will reevaluate for ultimate disposition.  The patient CT scans did not show any concerning findings.  She is discharged with return precautions.  Amount and/or Complexity of Data Reviewed Radiology: ordered.        Final diagnoses:  Closed head injury, initial encounter    ED Discharge Orders     None          Ula Prentice SAUNDERS, MD 10/29/23 1220

## 2023-10-29 NOTE — Discharge Instructions (Signed)
 Your CT scans did not show any concerning findings.  Please follow-up with your doctor and return to the ER for worsening symptoms.

## 2023-10-29 NOTE — ED Triage Notes (Signed)
 Patient presents to the ED after mechanical fall from standing this morning. No LOC, no blood thinners. Abrasions noted to L side of face. Denies headache.

## 2023-11-01 ENCOUNTER — Telehealth: Payer: Self-pay | Admitting: Pediatrics

## 2023-11-01 NOTE — Telephone Encounter (Signed)
 Patient called and stated that she would like to discuss her condition with Dr. Suzann or her nurse. Patient is requesting a call back. Please advise.

## 2023-11-02 ENCOUNTER — Telehealth: Payer: Self-pay | Admitting: Pediatrics

## 2023-11-02 NOTE — Telephone Encounter (Signed)
 I spoke on the telephone with Sandra Yang as she contacted our office and sent an update that she was feeling well.  I inquired if she had made any new changes.  She reports that she is not taking any medications for her stomach and is feeling well.  She briefly took Flagyl  but then had an allergic reaction necessitating it cessation.  She is not sure if that could have contributed to her overall wellbeing as SIBO could have been playing a role.  Advised that she continue conservative management and notify our office if she has any symptoms that warrant additional evaluation.

## 2023-11-02 NOTE — Telephone Encounter (Signed)
 Called and spoke with patient. Patient states that she just wanted to let you know that her stomach is feeling the best it ever has, and she is feeling great. Patient had no concerns at the end of the call.

## 2023-12-05 ENCOUNTER — Observation Stay (HOSPITAL_BASED_OUTPATIENT_CLINIC_OR_DEPARTMENT_OTHER)
Admission: EM | Admit: 2023-12-05 | Discharge: 2023-12-06 | Disposition: A | Discharge status: Home / Self Care | Attending: Family Medicine | Admitting: Family Medicine

## 2023-12-05 ENCOUNTER — Encounter (HOSPITAL_BASED_OUTPATIENT_CLINIC_OR_DEPARTMENT_OTHER): Payer: Self-pay

## 2023-12-05 ENCOUNTER — Emergency Department (HOSPITAL_BASED_OUTPATIENT_CLINIC_OR_DEPARTMENT_OTHER)

## 2023-12-05 ENCOUNTER — Other Ambulatory Visit: Payer: Self-pay

## 2023-12-05 DIAGNOSIS — R519 Headache, unspecified: Secondary | ICD-10-CM | POA: Diagnosis present

## 2023-12-05 DIAGNOSIS — R42 Dizziness and giddiness: Secondary | ICD-10-CM | POA: Insufficient documentation

## 2023-12-05 DIAGNOSIS — F1090 Alcohol use, unspecified, uncomplicated: Secondary | ICD-10-CM | POA: Diagnosis not present

## 2023-12-05 DIAGNOSIS — Z8719 Personal history of other diseases of the digestive system: Secondary | ICD-10-CM | POA: Diagnosis not present

## 2023-12-05 DIAGNOSIS — Z87891 Personal history of nicotine dependence: Secondary | ICD-10-CM | POA: Diagnosis not present

## 2023-12-05 DIAGNOSIS — I10 Essential (primary) hypertension: Secondary | ICD-10-CM | POA: Diagnosis present

## 2023-12-05 DIAGNOSIS — E871 Hypo-osmolality and hyponatremia: Secondary | ICD-10-CM | POA: Diagnosis not present

## 2023-12-05 DIAGNOSIS — Z7982 Long term (current) use of aspirin: Secondary | ICD-10-CM | POA: Insufficient documentation

## 2023-12-05 LAB — CBC
HCT: 36.5 % (ref 36.0–46.0)
Hemoglobin: 12.7 g/dL (ref 12.0–15.0)
MCH: 31 pg (ref 26.0–34.0)
MCHC: 34.8 g/dL (ref 30.0–36.0)
MCV: 89 fL (ref 80.0–100.0)
Platelets: 165 K/uL (ref 150–400)
RBC: 4.1 MIL/uL (ref 3.87–5.11)
RDW: 12.9 % (ref 11.5–15.5)
WBC: 3.6 K/uL — ABNORMAL LOW (ref 4.0–10.5)
nRBC: 0 % (ref 0.0–0.2)

## 2023-12-05 LAB — TROPONIN T, HIGH SENSITIVITY: Troponin T High Sensitivity: <15 ng/L (ref 0–19)

## 2023-12-05 LAB — COMPREHENSIVE METABOLIC PANEL WITH GFR
ALT: 11 U/L (ref 0–44)
AST: 20 U/L (ref 15–41)
Albumin: 4.1 g/dL (ref 3.5–5.0)
Alkaline Phosphatase: 70 U/L (ref 38–126)
Anion gap: 11 (ref 5–15)
BUN: 15 mg/dL (ref 8–23)
CO2: 23 mmol/L (ref 22–32)
Calcium: 9.2 mg/dL (ref 8.9–10.3)
Chloride: 95 mmol/L — ABNORMAL LOW (ref 98–111)
Creatinine, Ser: 0.96 mg/dL (ref 0.44–1.00)
GFR, Estimated: 57 mL/min — ABNORMAL LOW (ref >60)
Glucose, Bld: 100 mg/dL — ABNORMAL HIGH (ref 70–99)
Potassium: 4.1 mmol/L (ref 3.5–5.1)
Sodium: 128 mmol/L — ABNORMAL LOW (ref 135–145)
Total Bilirubin: 0.4 mg/dL (ref 0.0–1.2)
Total Protein: 6.5 g/dL (ref 6.5–8.1)

## 2023-12-05 LAB — RESP PANEL BY RT-PCR (RSV, FLU A&B, COVID)  RVPGX2
Influenza A by PCR: NEGATIVE
Influenza B by PCR: NEGATIVE
Resp Syncytial Virus by PCR: NEGATIVE
SARS Coronavirus 2 by RT PCR: NEGATIVE

## 2023-12-05 MED ORDER — LABETALOL HCL 5 MG/ML IV SOLN
5.0000 mg | INTRAVENOUS | Status: DC | PRN
  Administered 2023-12-06: 5 mg via INTRAVENOUS
  Filled 2023-12-05: qty 4

## 2023-12-05 MED ORDER — SODIUM CHLORIDE 0.9 % IV BOLUS
500.0000 mL | Freq: Once | INTRAVENOUS | Status: AC
  Administered 2023-12-05: 500 mL via INTRAVENOUS

## 2023-12-05 NOTE — ED Provider Notes (Signed)
 [place] EMERGENCY DEPARTMENT AT [facility] Provider Note   CSN: [id number] Arrival date & time: 12/05/23  2155     Patient presents with: Headache, Dizziness, and Fatigue   Sandra Yang is a 89 y.o. female.   HPI Patient reports she has had a frontal headache for about 3 days.  She reports she does not typically have headaches.  Patient did have a fall with head injury 7\26\25.  She had extensive facial bruising that is now nearly healed.  She reports she did not have ongoing headache after that.  She has not had another fall.  However she had onset of frontal headache that is been fairly constant since onset.  Patient is felt fatigued and experienced a little bit of dizziness with position changes.  She reports this is not typical for her.  She has not had a fever or chills.  There has been some mild nausea.  No confusion.  No chest pain or shortness of breath, no focal weakness numbness or tingling of extremities.    Prior to Admission medications   Medication Sig Start Date End Date Taking? Authorizing Provider  amLODipine  (NORVASC ) 2.5 MG tablet Take 2.5 mg by mouth daily. Patient not taking: Reported on 09/28/2023 07/26/23   [provider]  aspirin  81 MG tablet Take 81 mg by mouth daily.   Patient not taking: Reported on 09/28/2023    [provider]  cholestyramine  (QUESTRAN ) 4 g packet Take 1 packet (4 g total) by mouth 2 (two) times daily. 08/11/23   Suzann Inocente HERO, MD  cyanocobalamin  (VITAMIN B12) 1000 MCG/ML injection Inject 1 mL (1,000 mcg total) into the muscle every 14 (fourteen) days for 365 doses. 09/28/23 09/11/37  Suzann Inocente HERO, MD  Ergocalciferol  10 MCG (400 UNIT) TABS Take 1 tablet by mouth daily. 10/28/16   [provider]  losartan  (COZAAR ) 50 MG tablet Take 50 mg by mouth daily. 06/23/23   [provider]  mesalamine  (LIALDA ) 1.2 g EC tablet Take 2 tablets (2.4 g total) by mouth in the morning and at bedtime. 04/20/23  09/28/23  Suzann Inocente HERO, MD  metoprolol  succinate (TOPROL -XL) 25 MG 24 hr tablet Take 25 mg by mouth daily. Patient not taking: Reported on 09/28/2023    [provider]  metroNIDAZOLE  (FLAGYL ) 250 MG tablet Take 1 tablet (250 mg total) by mouth 3 (three) times daily. 10/03/23   Suzann Inocente HERO, MD  MULTIPLE VITAMIN PO Take 1 tablet by mouth daily.    [provider]  omeprazole  (PRILOSEC) 20 MG capsule TAKE ONE CAPSULE BY MOUTH ONE TIME DAILY 03/31/23   Aneita Gwendlyn DASEN, MD  triamterene -hydrochlorothiazide  (MAXZIDE -25) 37.5-25 MG per tablet Take 1 tablet by mouth daily.  Patient not taking: Reported on 09/28/2023 11/09/11   [provider]    Allergies: Metronidazole , Atorvastatin, Azithromycin, Doxycycline, Erythromycin, Raloxifene, Tetanus immune globulin, Tetanus toxoids, Tetanus-diphtheria toxoids td, and Tape    Review of Systems  Updated Vital Signs BP (!) 186/60   Pulse (!) 58   Temp 97.9 F (36.6 C) (Oral)   Resp 18   Ht 4' 11 (1.499 m)   Wt 51.3 kg   SpO2 100%   BMI 22.82 kg/m   Physical Exam Constitutional:      Comments: Alert.  Nontoxic.  Clear mental status.  GCS 15.  No respiratory distress.  HENT:     Head:     Comments: Patient has subtle resolving ecchymosis on the face.  There is  no swelling.  This is from prior injury.  Nearly resolved.    Ears:     Comments: Both ear canals are clear.  Some tympanosclerosis particularly on the left no erythema or bulging.    Nose: Nose normal.     Mouth/Throat:     Mouth: Mucous membranes are moist.     Pharynx: Oropharynx is clear.  Eyes:     Extraocular Movements: Extraocular movements intact.     Pupils: Pupils are equal, round, and reactive to light.  Cardiovascular:     Rate and Rhythm: Normal rate and regular rhythm.  Pulmonary:     Effort: Pulmonary effort is normal.     Breath sounds: Normal breath sounds.  Abdominal:     General: There is no distension.     Palpations: Abdomen is  soft.     Tenderness: There is no abdominal tenderness. There is no guarding.  Musculoskeletal:        General: No swelling, tenderness or signs of injury. Normal range of motion.     Cervical back: Neck supple.     Right lower leg: No edema.     Left lower leg: No edema.  Skin:    General: Skin is warm and dry.  Neurological:     General: No focal deficit present.     Mental Status: She is oriented to person, place, and time.     Motor: No weakness.     Coordination: Coordination normal.     Comments: Speech is clear.  Content normal.  Patient follows commands not difficulty.  Grip strength symmetric 5/5.  Patient can elevate and hold each lower extremity off the bed.  Psychiatric:        Mood and Affect: Mood normal.     (all labs ordered are listed, but only abnormal results are displayed) Labs Reviewed  COMPREHENSIVE METABOLIC PANEL WITH GFR - Abnormal; Notable for the following components:      Result Value   Sodium 128 (*)    Chloride 95 (*)    Glucose, Bld 100 (*)    GFR, Estimated 57 (*)    All other components within normal limits  CBC - Abnormal; Notable for the following components:   WBC 3.6 (*)    All other components within normal limits  RESP PANEL BY RT-PCR (RSV, FLU A&B, COVID)  [id number]  URINALYSIS, ROUTINE W REFLEX MICROSCOPIC  CBG MONITORING, ED  TROPONIN T, HIGH SENSITIVITY    EKG: EKG Interpretation Date/Time:  Monday December 05 2023 22:06:47 EDT Ventricular Rate:  59 PR Interval:    QRS Duration:  74 QT Interval:  417 QTC Calculation: 406 R Axis:   78  Text Interpretation: sinus rythm with artifact. Low voltage, precordial leads no acute ischemic appearance. no sig change from previous Confirmed by Armenta Canning [phone]) on 12/05/2023 10:49:21 PM  Radiology: CT Head Wo Contrast Result Date: 12/05/2023 CLINICAL DATA:  New onset headaches, no known injury, initial encounter EXAM: CT HEAD WITHOUT CONTRAST TECHNIQUE: Contiguous axial images were  obtained from the base of the skull through the vertex without intravenous contrast. RADIATION DOSE REDUCTION: This exam was performed according to the departmental dose-optimization program which includes automated exposure control, adjustment of the mA and/or kV according to patient size and/or use of iterative reconstruction technique. COMPARISON:  10/29/2023 FINDINGS: Brain: No evidence of acute infarction, hemorrhage, hydrocephalus, extra-axial collection or mass lesion/mass effect. Vascular: No hyperdense vessel or unexpected calcification. Skull: Normal. Negative for fracture or focal lesion.  Sinuses/Orbits: No acute finding. Other: None. IMPRESSION: No acute intracranial abnormality noted. Electronically Signed   By: Oneil Devonshire M.D.   On: 12/05/2023 23:28     Procedures   Medications Ordered in the ED  sodium chloride  0.9 % bolus 500 mL (500 mLs Intravenous New Bag/Given 12/05/23 2334)    Clinical Course as of 12/05/23 2337  Mon Dec 05, 2023  2329 Stable 49 YOF with headache  Recent large fall Dizziness and weakness with the headache as well Na 128  [CC]    Clinical Course User Index [CC] Jerral Meth, MD                                 Medical Decision Making Amount and/or Complexity of Data Reviewed Labs: ordered. Radiology: ordered.   Patient presents as outlined.  She had onset of headache 3 days ago.  Headache is frontal in nature.  Patient did have traumatic injury just a little over a month ago.  Potential risk for delayed bleeding or spontaneous hemorrhage, will obtain CT imaging.  Patient also has some amount of malaise, could be viral illness with frontal headache and malaise will obtain respiratory panel for COVID and basic lab work.  CT head personally reviewed by myself and read by radiology no acute findings.  Sodium 128 potassium 4.1 chloride 95 GFR 57 white count 3.6 H&H 12.7 and 36 platelets 165  At this time troponin and COVID panel pending.  I have  ordered 500 cc normal saline for hyponatremia and possible dehydration.  Dr. Jerral to review remainder of results and assess for final disposition.     Final diagnoses:  Acute nonintractable headache, unspecified headache type  Dizziness  Hyponatremia    ED Discharge Orders     None          Armenta Canning, MD 12/05/23 2337

## 2023-12-05 NOTE — ED Triage Notes (Signed)
 Patient reports headache since Wednesday. She has been getting worse headaches since then. She also reports some dizziness weakness again. This started earlier tonight. She states that she also feels a bit more jittery. Had a fall about a month ago but no falls or head injury since.

## 2023-12-05 NOTE — ED Notes (Signed)
 Patient transported to CT

## 2023-12-06 ENCOUNTER — Encounter (HOSPITAL_COMMUNITY): Payer: Self-pay | Admitting: Internal Medicine

## 2023-12-06 DIAGNOSIS — R519 Headache, unspecified: Secondary | ICD-10-CM | POA: Diagnosis not present

## 2023-12-06 DIAGNOSIS — Z743 Need for continuous supervision: Secondary | ICD-10-CM | POA: Diagnosis not present

## 2023-12-06 DIAGNOSIS — I959 Hypotension, unspecified: Secondary | ICD-10-CM | POA: Diagnosis not present

## 2023-12-06 DIAGNOSIS — E871 Hypo-osmolality and hyponatremia: Secondary | ICD-10-CM | POA: Diagnosis present

## 2023-12-06 DIAGNOSIS — Z8719 Personal history of other diseases of the digestive system: Secondary | ICD-10-CM

## 2023-12-06 DIAGNOSIS — I10 Essential (primary) hypertension: Secondary | ICD-10-CM

## 2023-12-06 DIAGNOSIS — R42 Dizziness and giddiness: Secondary | ICD-10-CM

## 2023-12-06 DIAGNOSIS — G4489 Other headache syndrome: Secondary | ICD-10-CM | POA: Diagnosis not present

## 2023-12-06 LAB — URINALYSIS, ROUTINE W REFLEX MICROSCOPIC
Bacteria, UA: NONE SEEN
Bilirubin Urine: NEGATIVE
Glucose, UA: NEGATIVE mg/dL
Hgb urine dipstick: NEGATIVE
Ketones, ur: NEGATIVE mg/dL
Nitrite: NEGATIVE
Protein, ur: NEGATIVE mg/dL
Specific Gravity, Urine: <1.005 — ABNORMAL LOW (ref 1.005–1.030)
pH: 5.5 (ref 5.0–8.0)

## 2023-12-06 LAB — CBC
HCT: 37.5 % (ref 36.0–46.0)
Hemoglobin: 12.7 g/dL (ref 12.0–15.0)
MCH: 31 pg (ref 26.0–34.0)
MCHC: 33.9 g/dL (ref 30.0–36.0)
MCV: 91.5 fL (ref 80.0–100.0)
Platelets: 171 K/uL (ref 150–400)
RBC: 4.1 MIL/uL (ref 3.87–5.11)
RDW: 12.7 % (ref 11.5–15.5)
WBC: 3.7 K/uL — ABNORMAL LOW (ref 4.0–10.5)
nRBC: 0 % (ref 0.0–0.2)

## 2023-12-06 LAB — BASIC METABOLIC PANEL WITH GFR
Anion gap: 11 (ref 5–15)
Anion gap: 11 (ref 5–15)
Anion gap: 11 (ref 5–15)
BUN: 12 mg/dL (ref 8–23)
BUN: 11 mg/dL (ref 8–23)
BUN: 10 mg/dL (ref 8–23)
CO2: 21 mmol/L — ABNORMAL LOW (ref 22–32)
CO2: 20 mmol/L — ABNORMAL LOW (ref 22–32)
CO2: 22 mmol/L (ref 22–32)
Calcium: 8.9 mg/dL (ref 8.9–10.3)
Calcium: 9.1 mg/dL (ref 8.9–10.3)
Calcium: 9.4 mg/dL (ref 8.9–10.3)
Chloride: 100 mmol/L (ref 98–111)
Chloride: 104 mmol/L (ref 98–111)
Chloride: 103 mmol/L (ref 98–111)
Creatinine, Ser: 0.76 mg/dL (ref 0.44–1.00)
Creatinine, Ser: 0.65 mg/dL (ref 0.44–1.00)
Creatinine, Ser: 0.62 mg/dL (ref 0.44–1.00)
GFR, Estimated: >60 mL/min (ref >60)
GFR, Estimated: >60 mL/min (ref >60)
GFR, Estimated: >60 mL/min (ref >60)
Glucose, Bld: 90 mg/dL (ref 70–99)
Glucose, Bld: 89 mg/dL (ref 70–99)
Glucose, Bld: 95 mg/dL (ref 70–99)
Potassium: 3.9 mmol/L (ref 3.5–5.1)
Potassium: 3.6 mmol/L (ref 3.5–5.1)
Potassium: 4.6 mmol/L (ref 3.5–5.1)
Sodium: 133 mmol/L — ABNORMAL LOW (ref 135–145)
Sodium: 135 mmol/L (ref 135–145)
Sodium: 136 mmol/L (ref 135–145)

## 2023-12-06 LAB — SODIUM, URINE, RANDOM: Sodium, Ur: 38 mmol/L

## 2023-12-06 LAB — OSMOLALITY, URINE: Osmolality, Ur: 165 mosm/kg — ABNORMAL LOW (ref 300–900)

## 2023-12-06 LAB — OSMOLALITY: Osmolality: 276 mosm/kg (ref 275–295)

## 2023-12-06 LAB — TROPONIN T, HIGH SENSITIVITY: Troponin T High Sensitivity: <15 ng/L (ref 0–19)

## 2023-12-06 MED ORDER — LOSARTAN POTASSIUM 50 MG PO TABS
50.0000 mg | ORAL_TABLET | Freq: Every day | ORAL | Status: DC
  Administered 2023-12-06: 50 mg via ORAL
  Filled 2023-12-06: qty 1

## 2023-12-06 MED ORDER — SODIUM CHLORIDE 0.9% FLUSH: 3.0000 mL | INTRAVENOUS | Status: DC | PRN

## 2023-12-06 MED ORDER — ACETAMINOPHEN 650 MG RE SUPP
650.0000 mg | Freq: Four times a day (QID) | RECTAL | Status: DC | PRN
Start: 2023-12-06 — End: 2023-12-06

## 2023-12-06 MED ORDER — LOSARTAN POTASSIUM 50 MG PO TABS: 50.0000 mg | ORAL_TABLET | Freq: Every day | ORAL | Status: DC

## 2023-12-06 MED ORDER — DIPHENHYDRAMINE HCL 50 MG/ML IJ SOLN
25.0000 mg | Freq: Once | INTRAMUSCULAR | Status: AC
  Administered 2023-12-06: 25 mg via INTRAVENOUS
  Filled 2023-12-06: qty 1

## 2023-12-06 MED ORDER — SODIUM CHLORIDE 0.9% FLUSH
3.0000 mL | Freq: Two times a day (BID) | INTRAVENOUS | Status: DC
  Administered 2023-12-06 (×2): 3 mL via INTRAVENOUS

## 2023-12-06 MED ORDER — METOPROLOL SUCCINATE ER 25 MG PO TB24: 25.0000 mg | ORAL_TABLET | Freq: Every day | ORAL | Status: DC

## 2023-12-06 MED ORDER — ONDANSETRON HCL 4 MG/2ML IJ SOLN: 4.0000 mg | Freq: Four times a day (QID) | INTRAMUSCULAR | Status: DC | PRN

## 2023-12-06 MED ORDER — ACETAMINOPHEN 325 MG PO TABS: 650.0000 mg | ORAL_TABLET | Freq: Four times a day (QID) | ORAL | Status: DC | PRN

## 2023-12-06 MED ORDER — ONDANSETRON HCL 4 MG PO TABS: 4.0000 mg | ORAL_TABLET | Freq: Four times a day (QID) | ORAL | Status: DC | PRN

## 2023-12-06 MED ORDER — PANTOPRAZOLE SODIUM 40 MG PO TBEC
40.0000 mg | DELAYED_RELEASE_TABLET | Freq: Every day | ORAL | Status: DC
  Administered 2023-12-06: 40 mg via ORAL
  Filled 2023-12-06: qty 1

## 2023-12-06 MED ORDER — SODIUM CHLORIDE 0.9 % IV SOLN: 250.0000 mL | INTRAVENOUS | Status: DC | PRN

## 2023-12-06 MED ORDER — MESALAMINE 1.2 G PO TBEC
2.4000 g | DELAYED_RELEASE_TABLET | Freq: Every day | ORAL | Status: DC
  Administered 2023-12-06: 2.4 g via ORAL
  Filled 2023-12-06: qty 2

## 2023-12-06 MED ORDER — LOSARTAN POTASSIUM 50 MG PO TABS: 50.0000 mg | ORAL_TABLET | Freq: Every day | ORAL | 0 refills | Status: DC

## 2023-12-06 MED ORDER — CYANOCOBALAMIN 1000 MCG/ML IJ SOLN: 1000.0000 ug | INTRAMUSCULAR | Status: DC

## 2023-12-06 MED ORDER — CHOLESTYRAMINE LIGHT 4 G PO PACK
4.0000 g | PACK | Freq: Two times a day (BID) | ORAL | Status: DC
  Administered 2023-12-06 (×2): 4 g via ORAL
  Filled 2023-12-06 (×2): qty 1

## 2023-12-06 MED ORDER — PROCHLORPERAZINE EDISYLATE 10 MG/2ML IJ SOLN
5.0000 mg | Freq: Once | INTRAMUSCULAR | Status: AC
  Administered 2023-12-06: 5 mg via INTRAVENOUS
  Filled 2023-12-06: qty 2

## 2023-12-06 MED ORDER — ASPIRIN 81 MG PO CHEW
81.0000 mg | CHEWABLE_TABLET | Freq: Every day | ORAL | Status: DC
  Filled 2023-12-06: qty 1

## 2023-12-06 MED ORDER — SODIUM CHLORIDE 0.9 % IV SOLN: INTRAVENOUS | Status: AC

## 2023-12-06 MED ORDER — ENOXAPARIN SODIUM 30 MG/0.3ML IJ SOSY
30.0000 mg | PREFILLED_SYRINGE | INTRAMUSCULAR | Status: DC
  Administered 2023-12-06: 30 mg via SUBCUTANEOUS
  Filled 2023-12-06: qty 0.3

## 2023-12-06 MED ORDER — HYDRALAZINE HCL 20 MG/ML IJ SOLN
10.0000 mg | Freq: Four times a day (QID) | INTRAMUSCULAR | Status: DC | PRN
  Administered 2023-12-06: 10 mg via INTRAVENOUS
  Filled 2023-12-06: qty 1

## 2023-12-06 NOTE — Plan of Care (Signed)

## 2023-12-06 NOTE — ED Provider Notes (Signed)
 Care of patient received from prior provider at 12:13 AM, please see their note for complete H/P and care plan.  Received handoff per ED course.  Clinical Course as of 12/06/23 0013  Mon Dec 05, 2023  2329 Stable 13 YOF with headache  Recent large fall Dizziness and weakness with the headache as well Na 128  [CC]    Clinical Course User Index [CC] Jerral Meth, MD    Reassessment: Patient's headache and dizziness remained persistent.  Likely related to hyponatremia in the setting of recent reinitiation of hydrochlorothiazide . Treated with 500 cc bolus as patient appears dehydrated.  Consulted medicine for admission. Disposition:   Based on the above findings, I believe this patient is stable for admission.    Patient/family educated about specific findings on our evaluation and explained exact reasons for admission.  Patient/family educated about clinical situation and time was allowed to answer questions.   Admission team communicated with and agreed with need for admission. Patient admitted. Patient ready to move at this time.     Emergency Department Medication Summary:   Medications  sodium chloride  0.9 % bolus 500 mL (0 mLs Intravenous Stopped 12/06/23 0027)  prochlorperazine  (COMPAZINE ) injection 5 mg (5 mg Intravenous Given 12/06/23 0056)  diphenhydrAMINE  (BENADRYL ) injection 25 mg (25 mg Intravenous Given 12/06/23 0056)            Jerral Meth, MD 12/06/23 0211

## 2023-12-06 NOTE — H&P (Signed)
 History and Physical    Sandra Yang FMW:[id number] DOB: December 17, 1933 DOA: 12/05/2023  PCP: Yolande Toribio MATSU, MD   Patient coming from: Clinic   Chief Complaint:  Chief Complaint  Patient presents with   Headache   Dizziness   Fatigue   ED TRIAGE note:Patient reports headache since Wednesday. She has been getting worse headaches since then. She also reports some dizziness weakness again. This started earlier tonight. She states that she also feels a bit more jittery. Had a fall about a month ago but no falls or head injury since.   HPI:  Sandra Yang is a 89 y.o. female with medical history significant of history of Crohn's disease, essential hypertension and hyperlipidemia presented emergency department complaining of headache, dizziness, feeling groggy and confused for last few days. Patient denies any weakness, fall, tingling, numbness, and seizure.  Patient reported that she has a fall recently in July.   Patient denies any abdominal pain, nausea vomiting and diarrhea. At this time patient does not have any specific complaint.    At presentation to ED patient hypotensive blood pressure 94/66 afterward remained persistently hypertensive blood pressure upper 170s range.  Otherwise hemodynamically stable.  Afebrile. CT head no acute intracranial abnormality. CMP showed low sodium 128, low chloride 95 otherwise unremarkable. CBC unremarkable except low WBC count 3.6 Normal serum osmolarity. Normal troponin x 2. Respiratory panel negative for COVID RSV flu. UA showing decreased specific gravity and leukocyte present.  Negative for bacteria.  In the ED patient received total 500 mL of NS bolus.  Benadryl  and Compazine  for dizziness.  [facility] for further management of hyponatremia.  Significant labs in the ED: Lab Orders         Resp panel by RT-PCR (RSV, Flu A&B, Covid) Anterior Nasal Swab         Comprehensive  metabolic panel         CBC         Urinalysis, Routine w reflex microscopic -Urine, Clean Catch         Osmolality         Sodium, urine, random         Osmolality, urine         Basic metabolic panel         CBC         CBG monitoring, ED       Review of Systems:  Review of Systems  Constitutional:  Negative for chills, fever, malaise/fatigue and weight loss.  HENT:  Positive for hearing loss.   Respiratory:  Negative for cough and sputum production.   Cardiovascular:  Negative for chest pain and palpitations.  Gastrointestinal:  Negative for abdominal pain, diarrhea, heartburn, nausea and vomiting.  Genitourinary:  Negative for dysuria and urgency.  Musculoskeletal:  Negative for myalgias.  Neurological:  Positive for dizziness. Negative for seizures, weakness and headaches.  Psychiatric/Behavioral:  The patient is not nervous/anxious.     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 Her smoking use included cigarettes. She has never used smokeless tobacco. She reports current alcohol  use of about 2.0 standard drinks of alcohol  per week. She reports that she does not use drugs.  Allergies  Allergen Reactions   Metronidazole  Itching and Other (See Comments)    Flushed/hot    Atorvastatin Other (See Comments)    Other Reaction(s): cramps   Azithromycin     Other Reaction(s): itching   Doxycycline Hives   Erythromycin Itching   Raloxifene Other (See Comments)    Other reaction(s): Other (See Comments)  Other Reaction(s): hot flashes   Tetanus Immune Globulin     Other Reaction(s): swelling/fever   Tetanus Toxoids Swelling    Swelling of arm   Tetanus-Diphtheria Toxoids Td     Other Reaction(s): very swollen & red, doctor recommended not to have another tetanus   Tape Rash and Dermatitis    Family History  Problem Relation Age of Onset   Stroke Mother    Prostate cancer Father    Bone cancer Father    Breast cancer Sister    Colon cancer Neg Hx    Stomach cancer Neg Hx     Prior to Admission medications   Medication Sig Start Date End Date Taking? Authorizing Provider  amLODipine  (NORVASC ) 2.5 MG tablet Take 2.5 mg by mouth daily. Patient not taking: Reported on 09/28/2023 07/26/23   [provider]  aspirin  81 MG tablet Take 81 mg by mouth daily.   Patient not taking: Reported on 09/28/2023    [provider]  cholestyramine  (QUESTRAN ) 4 g packet Take 1 packet (4 g total) by mouth 2 (two) times daily. 08/11/23   Suzann Inocente HERO, MD  cyanocobalamin  (VITAMIN B12) 1000 MCG/ML injection Inject 1 mL (1,000 mcg total) into the muscle every 14 (fourteen) days for 365 doses. 09/28/23 09/11/37  Suzann Inocente HERO, MD  Ergocalciferol  10 MCG (400 UNIT) TABS Take 1 tablet by mouth daily. 10/28/16   [provider]  losartan  (COZAAR ) 50 MG tablet Take 50 mg by mouth daily. 06/23/23   [provider]  mesalamine  (LIALDA ) 1.2 g EC tablet Take 2 tablets (2.4 g total) by mouth in the morning and at bedtime. 04/20/23 09/28/23  Suzann Inocente HERO, MD  metoprolol  succinate (TOPROL -XL) 25 MG 24 hr tablet Take 25 mg by mouth daily. Patient not taking: Reported on 09/28/2023    [provider]  metroNIDAZOLE  (FLAGYL ) 250 MG tablet Take 1  tablet (250 mg total) by mouth 3 (three) times daily. 10/03/23   Suzann Inocente HERO, MD  MULTIPLE VITAMIN PO Take 1 tablet by mouth daily.    [provider]  omeprazole  (PRILOSEC) 20 MG capsule TAKE ONE CAPSULE BY MOUTH ONE TIME DAILY 03/31/23   Aneita Gwendlyn DASEN, MD  triamterene -hydrochlorothiazide  (MAXZIDE -25) 37.5-25 MG per tablet Take 1 tablet by mouth daily.  Patient not taking: Reported on 09/28/2023 11/09/11   [provider]     Physical Exam: Vitals:   12/06/23 0045 12/06/23 0100 12/06/23 0145 12/06/23 0227  BP: (!) 169/51 (!) 158/57 (!) 157/50 (!) 175/55  Pulse: (!) 55 65 (!) 54 (!) 57  Resp:   16  17  Temp:    (!) 97.5 F (36.4 C)  TempSrc:    Oral  SpO2: 98% 99% 98% 99%  Weight:    51.3 kg  Height:        Physical Exam Vitals and nursing note reviewed.  Constitutional:      General: She is not in acute distress.    Appearance: She is not ill-appearing.  HENT:     Mouth/Throat:     Mouth: Mucous membranes are moist.  Cardiovascular:     Rate and Rhythm: Regular rhythm.     Heart sounds: Normal heart sounds.  Pulmonary:     Effort: Pulmonary effort is normal.     Breath sounds: Normal breath sounds.  Abdominal:     General: Bowel sounds are normal.     Palpations: Abdomen is soft.  Musculoskeletal:        General: No swelling.     Cervical back: Normal range of motion.  Skin:    Capillary Refill: Capillary refill takes less than 2 seconds.  Neurological:     Mental Status: She is alert.  Psychiatric:        Mood and Affect: Mood normal.      Labs on Admission: I have personally reviewed following labs and imaging studies  CBC: Recent Labs  Lab 12/05/23 2210 12/06/23 0311  WBC 3.6* 3.7*  HGB 12.7 12.7  HCT 36.5 37.5  MCV 89.0 91.5  PLT 165 171   Basic Metabolic Panel: Recent Labs  Lab 12/05/23 2210 12/06/23 0311  NA 128* 133*  K 4.1 3.9  CL 95* 100  CO2 23 21*  GLUCOSE 100* 90  BUN 15 12  CREATININE 0.96 0.76  CALCIUM 9.2  8.9   GFR: Estimated Creatinine Clearance: 33.2 mL/min (by C-G formula based on SCr of 0.76 mg/dL). Liver Function Tests: Recent Labs  Lab 12/05/23 2210  AST 20  ALT 11  ALKPHOS 70  BILITOT 0.4  PROT 6.5  ALBUMIN 4.1   No results for input(s): LIPASE, AMYLASE in the last 168 hours. No results for input(s): AMMONIA in the last 168 hours. Coagulation Profile: No results for input(s): INR, PROTIME in the last 168 hours. Cardiac Enzymes: No results for input(s): CKTOTAL, CKMB, CKMBINDEX, TROPONINI, TROPONINIHS in the last 168 hours. BNP (last 3 results) No results for input(s): BNP in the last 8760 hours. HbA1C: No results for input(s): HGBA1C in the last 72 hours. CBG: No results for input(s): GLUCAP in the last 168 hours. Lipid Profile: No results for input(s): CHOL, HDL, LDLCALC, TRIG, CHOLHDL, LDLDIRECT in the last 72 hours. Thyroid  Function Tests: No results for input(s): TSH, T4TOTAL, FREET4, T3FREE, THYROIDAB in the last 72 hours. Anemia Panel: No results for input(s): VITAMINB12, FOLATE, FERRITIN, TIBC, IRON, RETICCTPCT in the last 72 hours. Urine analysis:    Component Value Date/Time   COLORURINE COLORLESS (A) 12/06/2023 0008   APPEARANCEUR CLEAR 12/06/2023 0008   LABSPEC <1.005 (L) 12/06/2023 0008   PHURINE 5.5 12/06/2023 0008   GLUCOSEU NEGATIVE 12/06/2023 0008   HGBUR NEGATIVE 12/06/2023 0008   BILIRUBINUR NEGATIVE 12/06/2023 0008   KETONESUR NEGATIVE 12/06/2023 0008   PROTEINUR NEGATIVE 12/06/2023 0008   NITRITE NEGATIVE 12/06/2023 0008   LEUKOCYTESUR MODERATE (A) 12/06/2023 0008    Radiological Exams on Admission: I have personally reviewed images CT Head Wo Contrast Result Date: 12/05/2023 CLINICAL DATA:  New onset headaches, no known injury, initial encounter EXAM: CT HEAD WITHOUT CONTRAST TECHNIQUE: Contiguous axial images were obtained from the  base of the skull through the vertex without  intravenous contrast. RADIATION DOSE REDUCTION: This exam was performed according to the departmental dose-optimization program which includes automated exposure control, adjustment of the mA and/or kV according to patient size and/or use of iterative reconstruction technique. COMPARISON:  10/29/2023 FINDINGS: Brain: No evidence of acute infarction, hemorrhage, hydrocephalus, extra-axial collection or mass lesion/mass effect. Vascular: No hyperdense vessel or unexpected calcification. Skull: Normal. Negative for fracture or focal lesion. Sinuses/Orbits: No acute finding. Other: None. IMPRESSION: No acute intracranial abnormality noted. Electronically Signed   By: Oneil Devonshire M.D.   On: 12/05/2023 23:28     EKG: My personal interpretation of EKG shows: Normal sinus rhythm heart rate 59.  Low voltage precordial lead.    Assessment/Plan: Principal Problem:   Hyponatremia Active Problems:   Essential hypertension   Dizziness   History of Crohn's disease    Assessment and Plan: Hyponatremia Hypochloremia Dizziness and feeling overly in the setting of hyponatremia -Patient presented to emergency department complaining of feeling dizzy for last few days.  At presentation to ED patient initially found hypotensive however afterwards remained persistently hypertensive.  Patient denies any nausea, vomiting and diarrhea. -CT head no acute intracranial abnormality. -CMP showed low sodium 128, low chloride 95 otherwise unremarkable. CBC unremarkable except low WBC count 3.6 Normal serum osmolarity. Normal troponin x 2. Respiratory panel negative for COVID RSV flu. UA showing decreased specific gravity and leukocyte present.  Negative for bacteria. - In the ED patient received total 500 mL of NS bolus.  Benadryl  and Compazine  for dizziness. Plan - Given normal serum osmolarity and UA showing low specific gravity.  Pending urine osmolarity and urine sodium.  - In the ED patient received 500 mL of NS  bolus.   -Serum sodium has been improved 128-130 5:03 100 mL of NS bolus in the ED.  Starting low-dose maintenance fluid NS 75 cc/h for 10 hours. - Continue to monitor BMP 6 hours. -Holding losartan  in the setting of hyponatremia. -Patient reported not taking Maxide, amlodipine  and Toprol -XL anymore.   Chronic dizziness  - Consulting PT and OT for evaluation for balance.   Essential hypertension -Patient reported not taking Maxide, amlodipine  Toprol -XL anymore. -Losartan  on hold in setting of hyponatremia - Continue hypotelorism as needed.  History of Crohn's disease -Patient denies any diarrhea. - Continue cholestyramine  twice daily and Lialda  EC 2.4 g daily.   DVT prophylaxis:  Lovenox  Code Status:  Full Code Diet: Heart healthy diet Family Communication:   Family was present at bedside, at the time of interview. Opportunity was given to ask question and all questions were answered satisfactorily.  Disposition Plan: Continue to monitor improvement of serum sodium level. Consults: Consulted PT and OT Admission status:   Observation, progressive unit  Severity of Illness: The appropriate patient status for this patient is OBSERVATION. Observation status is judged to be reasonable and necessary in order to provide the required intensity of service to ensure the patient's safety. The patient's presenting symptoms, physical exam findings, and initial radiographic and laboratory data in the context of their medical condition is felt to place them at decreased risk for further clinical deterioration. Furthermore, it is anticipated that the patient will be medically stable for discharge from the hospital within 2 midnights of admission.     Sina Sumpter, MD [name]  How to contact the [facility] Attending or Consulting provider 7A - 7P or covering provider during after hours 7P -7A, for this patient.  Check the care  team in [facility] and look for a) attending/consulting TR[facility] provider  listed and b) the TRH team listed Log into [email or web address] and use [facility] universal password to access. If you do not have the password, please contact the hospital operator. Locate the TR[facility] provider you are looking for under [name] and page to a number that you can be directly reached. If you still have difficulty reaching the provider, please page the [facility] (Director on Call) for the Hospitalists listed on amion for assistance.  12/06/2023, 5:48 AM

## 2023-12-06 NOTE — Progress Notes (Signed)
 Pt being d/c.  PIVs removed

## 2023-12-06 NOTE — Evaluation (Signed)
 Occupational Therapy Evaluation Patient Details Name: Sandra Yang MRN: [id number] DOB: [birth year] Today's Date: 12/06/2023   History of Present Illness   Pt presented to ED for evaluation of HA, dizziness and confusion. MD assessment includes hyponatremia. CT scan negative. PMH significant for Crohn's disease, HTN and HLD, recent fall with head injury 10/29/23.     Clinical Impressions Pt admitted with above diagnosis. Prior to admission, pt lives alone and was independent including driving. Daughter present for session. Pt completes all tasks including bed mobility, transfers, ADLs and mobility in hallway no AD 200 ft with CGA-supervision. No LOB observed with pt reaching outside BOS to throw paper towels away, picking up dropped item from floor and with directional turns in hallway. Mild safety awareness deficits as pt requires cues for IV line + pole mgmt for safety throughout. Pt states she feels at functional baseline, daughter will be staying with pt 24/7 upon hospital discharge. Pt politely declines need for PT evaluation, secure chat sent to PT. No further acute needs identified, OT will complete orders and sign off.      If plan is discharge home, recommend the following:   A little help with walking and/or transfers;A little help with bathing/dressing/bathroom;Direct supervision/assist for medications management;Direct supervision/assist for financial management;Assist for transportation;Help with stairs or ramp for entrance;Supervision due to cognitive status     Functional Status Assessment   Patient has not had a recent decline in their functional status     Equipment Recommendations   None recommended by OT      Precautions/Restrictions   Precautions Precautions: Fall Recall of Precautions/Restrictions: Impaired Restrictions Weight Bearing Restrictions Per Provider Order: No     Mobility Bed Mobility Overal bed mobility: Needs Assistance Bed Mobility:  Supine to Sit     Supine to sit: Supervision     General bed mobility comments: cues for IV line mgmt (pt with poor awareness)    Transfers Overall transfer level: Needs assistance Equipment used: None Transfers: Sit to/from Stand, Bed to chair/wheelchair/BSC Sit to Stand: Supervision     Step pivot transfers: Supervision     General transfer comment: multiple STS observed, no LOB      Balance Overall balance assessment: Needs assistance Sitting-balance support: Feet supported, No upper extremity supported Sitting balance-Leahy Scale: Normal     Standing balance support: No upper extremity supported, During functional activity Standing balance-Leahy Scale: Good Standing balance comment: no LOB during dynamic balance reaching outside BOS to throw items away, picks up item off floor without LOB                           ADL either performed or assessed with clinical judgement   ADL Overall ADL's : Needs assistance/impaired Eating/Feeding: Independent;Sitting   Grooming: Oral care;Supervision/safety;Standing Grooming Details (indicate cue type and reason): sink level                 Toilet Transfer: Supervision/safety;Ambulation;Regular Teacher, adult education Details (indicate cue type and reason): bathroom level, supervision for safety Toileting- Clothing Manipulation and Hygiene: Supervision/safety;Sit to/from stand       Functional mobility during ADLs: Supervision/safety General ADL Comments: bathroom level toileting, functional mobility in hallway 200 ft, no AD, CGA-supervision for safety. Pt states feeling at her functional baseline     Vision Baseline Vision/History: 1 Wears glasses Ability to See in Adequate Light: 0 Adequate Patient Visual Report: No change from baseline  Pertinent Vitals/Pain Pain Assessment Pain Assessment: No/denies pain     Extremity/Trunk Assessment Upper Extremity Assessment Upper Extremity  Assessment: Overall WFL for tasks assessed   Lower Extremity Assessment Lower Extremity Assessment: Overall WFL for tasks assessed   Cervical / Trunk Assessment Cervical / Trunk Assessment: Normal   Communication Communication Communication: Impaired Factors Affecting Communication: Hearing impaired   Cognition Arousal: Alert Behavior During Therapy: WFL for tasks assessed/performed Cognition: Cognition impaired     Awareness: Online awareness impaired Memory impairment (select all impairments): Short-term memory, Working memory Attention impairment (select first level of impairment): Alternating attention Executive functioning impairment (select all impairments): Problem solving OT - Cognition Comments: decreased safety awareness with IV pole, slowed processing at times (question impact of HOH on overall cognition), daughter says she has been noticing mild deficits in [facility]                 Following commands: Impaired Following commands impaired: Follows one step commands with increased time     Cueing  General Comments   Cueing Techniques: Verbal cues  VSS. Pt denies pain or dizziness throughout. Pt politely declines PT eval end of session as she feels as she is at functional baseline.           Home Living Family/patient expects to be discharged to:: Private residence Living Arrangements: Alone Available Help at Discharge: Family;Available PRN/intermittently (daughter avail to stay with pt 24/7 after discharge) Type of Home: House Home Access: Stairs to enter [employer] of Steps: 2 Entrance Stairs-Rails: Right Home Layout: One level     Bathroom Shower/Tub: Producer, television/film/video: Standard     Home Equipment: Shower seat;Grab bars - toilet;Grab bars - tub/shower          Prior Functioning/Environment Prior Level of Function : Independent/Modified Independent;Driving;History of Falls (last six months)             Mobility  Comments: independent without AD, last fall caused by tripping over threshold outside on uneven pavement ADLs Comments: independent, drives.    OT Problem List: Decreased activity tolerance;Decreased safety awareness;Decreased cognition;Decreased knowledge of use of DME or AE        OT Goals(Current goals can be found in the care plan section)   Acute Rehab OT Goals OT Goal Formulation: All assessment and education complete, DC therapy Potential to Achieve Goals: Good   AM-PAC OT 6 Clicks Daily Activity     Outcome Measure Help from another person eating meals?: None Help from another person taking care of personal grooming?: None Help from another person toileting, which includes using toliet, bedpan, or urinal?: A Little Help from another person bathing (including washing, rinsing, drying)?: A Little Help from another person to put on and taking off regular upper body clothing?: None Help from another person to put on and taking off regular lower body clothing?: A Little 6 Click Score: 21   End of Session Equipment Utilized During Treatment: Gait belt Nurse Communication: Mobility status  Activity Tolerance: Patient tolerated treatment well Patient left: in chair;with call bell/phone within reach;with chair alarm set;with family/visitor present;with nursing/sitter in room  OT Visit Diagnosis: Muscle weakness (generalized) (M62.81);Other abnormalities of gait and mobility (R26.89)                Time: 9154-9076 OT Time Calculation (min): 38 min Charges:  OT General Charges $OT Visit: 1 Visit OT Evaluation $OT Eval Low Complexity: 1 Low OT Treatments $Self Care/Home Management : 23-37 mins  Zayra Devito L. Axle Parfait, OTR/L  12/06/23, 12:46 PM

## 2023-12-06 NOTE — Progress Notes (Signed)
 PT Cancellation Note  Patient Details Name: Sandra Yang MRN: [id number] DOB: 05-Sep-1933   Cancelled Treatment:    Reason Eval/Treat Not Completed: PT screened, no needs identified, will sign off. Per OT, thru secure chat, pt is at her baseline and declines PT eval    Dannial SQUIBB, PT Acute Rehabilitation  Office: [phone]

## 2023-12-06 NOTE — Discharge Instructions (Signed)
 Also make sure to follow-up with your PCP later this week to ensure that your blood pressure remains good on the losartan  while you are waiting to be seen by cardiology.  You should also have recheck of your blood work to ensure that your sodium remains normal.

## 2023-12-06 NOTE — Discharge Summary (Signed)
 Physician Discharge Summary   Patient: Sandra Yang MRN: [id number] DOB: 23-Sep-1933  Admit date:     12/05/2023  Discharge date: 12/06/23  Discharge Physician: Reyes VEAR Gaw   PCP: Yolande Toribio MATSU, MD   Recommendations at discharge:   Patient recommended to follow-up with PCP in the next several days to ensure that hyponatremia has remained resolved. Patient's hydrochlorothiazide  was stopped as this was the most likely cause of her hyponatremia and the fact that her sodium corrected to normal ranges once off this medicine. Does have history of high blood pressure.  Patient would like to switch to [facility] heart care and I have put in an ambulatory referral for her for this.  Please follow-up to ensure that they have contacted her.  Discharge Diagnoses: Principal Problem:   Hyponatremia Active Problems:   Essential hypertension   Dizziness   History of Crohn's disease  Resolved Problems:   * No resolved hospital problems. *  Hospital Course: 89 year old female with history of Crohn's disease, hypertension, GERD presented for evaluation of headache, dizziness, and feeling groggy/confused for the past few days. She had a fall with head injury at the end of July. SBP reportedly in the 90s on arrival to the ED but later became hypertensive with SBP up to 190s, most recent in the 170s. CT head negative for acute intracranial abnormality. COVID/influenza/RSV negative. Sodium 128, previously 137 on 09/28/2023. Patient was given 500 mL normal saline.  [facility] for the same.  She remained on IV fluids overnight.  Did not receive any blood pressure medicines while in house.  Her sodium corrected nicely from 128--> 133--> 135 on day of discharge.  Headache also resolved prior to discharge.  Patient's hyponatremia deemed to be secondary to HCTZ usage which she reported was combination of losartan /HCTZ outpatient.  We continued her losartan  outpatient and stopped her HCTZ.  She  also requested ambulatory referral to heart and vascular Center and this was placed prior to discharge.  Due to her degree of improvement she was discharged home on 12/06/2023 with recommendations to follow-up later this week with PCP to ensure that her sodium remains normal range and just take losartan  on an outpatient basis rather than any thiazide diuretics.    She has been on both metoprolol  in past but had bradycardia and amlodipine  outpatient as well but had leg swelling.  Defer to outpatient workup for any further blood pressure medications on top of her losartan .  Assessment and Plan: Hyponatremia Hypochloremia Dizziness and feeling overly in the setting of hyponatremia -All symptoms fully resolved prior to discharge.  Deemed secondary to thiazide usage.       Chronic dizziness  - Consulting PT and OT for evaluation for balance. -Resolved and therefore PT did not see her inpatient.     Essential hypertension -Patient reported not taking Maxide, amlodipine  Toprol -XL anymore. -Losartan  on hold in setting of hyponatremia--> restarted at discharge.   History of Crohn's disease -Patient denies any diarrhea. - Continue cholestyramine  twice daily and Lialda  EC 2.4 g daily.        Consultants: Ambulatory referral to cardiology placed Procedures performed: None Disposition: Home Diet recommendation:  Discharge Diet Orders (From admission, onward)     Start     Ordered   12/06/23 0000  Diet - low sodium heart healthy        12/06/23 1435           Regular diet DISCHARGE MEDICATION: Allergies as of 12/06/2023  Reactions   Metronidazole  Itching, Other (See Comments)   Flushed/hot   Atorvastatin Other (See Comments)   Other Reaction(s): cramps   Azithromycin    Other Reaction(s): itching   Doxycycline Hives   Erythromycin Itching   Raloxifene Other (See Comments)   Other reaction(s): Other (See Comments) Other Reaction(s): hot flashes   Tetanus Immune Globulin     Other Reaction(s): swelling/fever   Tetanus Toxoids Swelling   Swelling of arm   Tetanus-diphtheria Toxoids Td    Other Reaction(s): very swollen & red, doctor recommended not to have another tetanus   Tape Rash, Dermatitis        Medication List     STOP taking these medications    amLODipine  2.5 MG tablet Commonly known as: NORVASC    metoprolol  succinate 25 MG 24 hr tablet Commonly known as: TOPROL -XL   metroNIDAZOLE  250 MG tablet Commonly known as: FLAGYL    triamterene -hydrochlorothiazide  37.5-25 MG tablet Commonly known as: MAXZIDE -25       TAKE these medications    aspirin  81 MG tablet Take 81 mg by mouth daily.   cholestyramine  4 g packet Commonly known as: Questran  Take 1 packet (4 g total) by mouth 2 (two) times daily.   cyanocobalamin  1000 MCG/ML injection Commonly known as: VITAMIN B12 Inject 1 mL (1,000 mcg total) into the muscle every 14 (fourteen) days for 365 doses.   Ergocalciferol  10 MCG (400 UNIT) Tabs Take 1 tablet by mouth daily.   losartan  50 MG tablet Commonly known as: COZAAR  Take 50 mg by mouth daily.   mesalamine  1.2 g EC tablet Commonly known as: LIALDA  Take 2 tablets (2.4 g total) by mouth in the morning and at bedtime.   MULTIPLE VITAMIN PO Take 1 tablet by mouth daily.   omeprazole  20 MG capsule Commonly known as: PRILOSEC TAKE ONE CAPSULE BY MOUTH ONE TIME DAILY        Discharge Exam: Filed Weights   12/05/23 2247 12/06/23 0227  Weight: 51.3 kg 51.3 kg   Gen:  Alert, cooperative patient who appears stated age in no acute distress.  Vital signs reviewed. Cardiac:  Regular rate and rhythm without murmur auscultated.  Good S1/S2. Pulm:  Clear to auscultation bilaterally with good air movement.  No wheezes or rales noted.   Abd:  S/ND/NT Extr: No LE edema Neuro: Alert and oriented x 4.  No focal deficits noted and moving all limbs symmetrically. Psych: Pleasant, linear and coherent thought process  Condition at  discharge: Good  The results of significant diagnostics from this hospitalization (including imaging, microbiology, ancillary and laboratory) are listed below for reference.   Imaging Studies: CT Head Wo Contrast Result Date: 12/05/2023 CLINICAL DATA:  New onset headaches, no known injury, initial encounter EXAM: CT HEAD WITHOUT CONTRAST TECHNIQUE: Contiguous axial images were obtained from the base of the skull through the vertex without intravenous contrast. RADIATION DOSE REDUCTION: This exam was performed according to the departmental dose-optimization program which includes automated exposure control, adjustment of the mA and/or kV according to patient size and/or use of iterative reconstruction technique. COMPARISON:  10/29/2023 FINDINGS: Brain: No evidence of acute infarction, hemorrhage, hydrocephalus, extra-axial collection or mass lesion/mass effect. Vascular: No hyperdense vessel or unexpected calcification. Skull: Normal. Negative for fracture or focal lesion. Sinuses/Orbits: No acute finding. Other: None. IMPRESSION: No acute intracranial abnormality noted. Electronically Signed   By: Oneil Devonshire M.D.   On: 12/05/2023 23:28    Microbiology: Results for orders placed or performed during the hospital encounter  of 12/05/23  Resp panel by RT-PCR (RSV, Flu A&B, Covid) Anterior Nasal Swab     Status: None   Collection Time: 12/05/23 11:05 PM   Specimen: Anterior Nasal Swab  Result Value Ref Range Status   SARS Coronavirus 2 by RT PCR NEGATIVE NEGATIVE Final    Comment: (NOTE) SARS-CoV-2 target nucleic acids are NOT DETECTED.  The SARS-CoV-2 RNA is generally detectable in upper respiratory specimens during the acute phase of infection. The lowest concentration of SARS-CoV-2 viral copies this assay can detect is 138 copies/mL. A negative result does not preclude SARS-Cov-2 infection and should not be used as the sole basis for treatment or other patient management decisions. A negative  result may occur with  improper specimen collection/handling, submission of specimen other than nasopharyngeal swab, presence of viral mutation(s) within the areas targeted by this assay, and inadequate number of viral copies(<138 copies/mL). A negative result must be combined with clinical observations, patient history, and epidemiological information. The expected result is Negative.  Fact Sheet for Patients:  BloggerCourse.com  Fact Sheet for Healthcare Providers:  SeriousBroker.it  This test is no t yet approved or cleared by the [place]  FDA and  has been authorized for detection and/or diagnosis of SARS-CoV-2 by FDA under an Emergency Use Authorization (EUA). This EUA will remain  in effect (meaning this test can be used) for the duration of the COVID-19 declaration under Section 564(b)(1) of the Act, 21 U.S.C.section 360bbb-3(b)(1), unless the authorization is terminated  or revoked sooner.       Influenza A by PCR NEGATIVE NEGATIVE Final   Influenza B by PCR NEGATIVE NEGATIVE Final    Comment: (NOTE) The Xpert Xpress SARS-CoV-2/FLU/RSV plus assay is intended as an aid in the diagnosis of influenza from Nasopharyngeal swab specimens and should not be used as a sole basis for treatment. Nasal washings and aspirates are unacceptable for Xpert Xpress SARS-CoV-2/FLU/RSV testing.  Fact Sheet for Patients: BloggerCourse.com  Fact Sheet for Healthcare Providers: SeriousBroker.it  This test is not yet approved or cleared by the [place]  FDA and has been authorized for detection and/or diagnosis of SARS-CoV-2 by FDA under an Emergency Use Authorization (EUA). This EUA will remain in effect (meaning this test can be used) for the duration of the COVID-19 declaration under Section 564(b)(1) of the Act, 21 U.S.C. section 360bbb-3(b)(1), unless the authorization is  terminated or revoked.     Resp Syncytial Virus by PCR NEGATIVE NEGATIVE Final    Comment: (NOTE) Fact Sheet for Patients: BloggerCourse.com  Fact Sheet for Healthcare Providers: SeriousBroker.it  This test is not yet approved or cleared by the [place]  FDA and has been authorized for detection and/or diagnosis of SARS-CoV-2 by FDA under an Emergency Use Authorization (EUA). This EUA will remain in effect (meaning this test can be used) for the duration of the COVID-19 declaration under Section 564(b)(1) of the Act, 21 U.S.C. section 360bbb-3(b)(1), unless the authorization is terminated or revoked.  Performed at [facility], [address] [place], [place] [address]     Labs: CBC: Recent Labs  Lab 12/05/23 2210 12/06/23 0311  WBC 3.6* 3.7*  HGB 12.7 12.7  HCT 36.5 37.5  MCV 89.0 91.5  PLT 165 171   Basic Metabolic Panel: Recent Labs  Lab 12/05/23 2210 12/06/23 0311 12/06/23 0857  NA 128* 133* 135  K 4.1 3.9 3.6  CL 95* 100 104  CO2 23 21* 20*  GLUCOSE 100* 90 89  BUN 15 12 11  CREATININE 0.96 0.76 0.65  CALCIUM 9.2 8.9 9.1   Liver Function Tests: Recent Labs  Lab 12/05/23 2210  AST 20  ALT 11  ALKPHOS 70  BILITOT 0.4  PROT 6.5  ALBUMIN 4.1   CBG: No results for input(s): GLUCAP in the last 168 hours.  Discharge time spent: Less than 30 minutes.  Signed: Reyes VEAR Gaw, MD [name] 12/06/2023

## 2023-12-06 NOTE — Plan of Care (Signed)
 Plan of Care Note for accepted transfer   Patient name: Sandra Yang FMW:[id number] DOB: 05-03-1933  Facility requesting transfer: [facility] Requesting Provider: Dr. Jerral Facility course: 89 year old female with history of Crohn's disease, hypertension, GERD presented for evaluation of headache, dizziness, and feeling groggy/confused for the past few days.  She had a fall with head injury at the end of July.  SBP reportedly in the 90s on arrival to the ED but later became hypertensive with SBP up to 190s, most recent in the 170s.  CT head negative for acute intracranial abnormality.  COVID/influenza/RSV negative.  Sodium 128, previously 137 on 09/28/2023.  Patient was given 500 mL normal saline.  Plan of care: The patient is accepted for admission to Progressive unit at [facility].  [facility] will assume care on arrival to accepting facility. Until arrival, care as per EDP. However, [facility] available 24/7 for questions and assistance.  Check [email or web address] for on-call coverage.  Nursing staff, please call [facility] Admits & Consults System-Wide number under Amion on patient's arrival so appropriate admitting provider can evaluate the pt.

## 2023-12-11 ENCOUNTER — Encounter (HOSPITAL_BASED_OUTPATIENT_CLINIC_OR_DEPARTMENT_OTHER): Payer: Self-pay | Admitting: Emergency Medicine

## 2023-12-11 ENCOUNTER — Emergency Department (HOSPITAL_BASED_OUTPATIENT_CLINIC_OR_DEPARTMENT_OTHER): Admitting: Radiology

## 2023-12-11 ENCOUNTER — Emergency Department (HOSPITAL_BASED_OUTPATIENT_CLINIC_OR_DEPARTMENT_OTHER)

## 2023-12-11 ENCOUNTER — Emergency Department (HOSPITAL_BASED_OUTPATIENT_CLINIC_OR_DEPARTMENT_OTHER)
Admission: EM | Admit: 2023-12-11 | Discharge: 2023-12-12 | Disposition: A | Discharge status: Home / Self Care | Attending: Emergency Medicine | Admitting: Emergency Medicine

## 2023-12-11 ENCOUNTER — Other Ambulatory Visit: Payer: Self-pay

## 2023-12-11 DIAGNOSIS — R030 Elevated blood-pressure reading, without diagnosis of hypertension: Secondary | ICD-10-CM | POA: Diagnosis not present

## 2023-12-11 DIAGNOSIS — R531 Weakness: Secondary | ICD-10-CM | POA: Diagnosis not present

## 2023-12-11 DIAGNOSIS — R519 Headache, unspecified: Secondary | ICD-10-CM

## 2023-12-11 DIAGNOSIS — I10 Essential (primary) hypertension: Secondary | ICD-10-CM | POA: Insufficient documentation

## 2023-12-11 DIAGNOSIS — I159 Secondary hypertension, unspecified: Secondary | ICD-10-CM | POA: Diagnosis not present

## 2023-12-11 DIAGNOSIS — R001 Bradycardia, unspecified: Secondary | ICD-10-CM | POA: Insufficient documentation

## 2023-12-11 DIAGNOSIS — R918 Other nonspecific abnormal finding of lung field: Secondary | ICD-10-CM | POA: Diagnosis not present

## 2023-12-11 DIAGNOSIS — Z79899 Other long term (current) drug therapy: Secondary | ICD-10-CM | POA: Diagnosis not present

## 2023-12-11 DIAGNOSIS — J9809 Other diseases of bronchus, not elsewhere classified: Secondary | ICD-10-CM | POA: Diagnosis not present

## 2023-12-11 LAB — BASIC METABOLIC PANEL WITH GFR
Anion gap: 12 (ref 5–15)
BUN: 13 mg/dL (ref 8–23)
CO2: 22 mmol/L (ref 22–32)
Calcium: 9.6 mg/dL (ref 8.9–10.3)
Chloride: 103 mmol/L (ref 98–111)
Creatinine, Ser: 0.85 mg/dL (ref 0.44–1.00)
GFR, Estimated: >60 mL/min (ref >60)
Glucose, Bld: 91 mg/dL (ref 70–99)
Potassium: 4 mmol/L (ref 3.5–5.1)
Sodium: 136 mmol/L (ref 135–145)

## 2023-12-11 LAB — CBC
HCT: 34.7 % — ABNORMAL LOW (ref 36.0–46.0)
Hemoglobin: 12 g/dL (ref 12.0–15.0)
MCH: 30.8 pg (ref 26.0–34.0)
MCHC: 34.6 g/dL (ref 30.0–36.0)
MCV: 89 fL (ref 80.0–100.0)
Platelets: 157 K/uL (ref 150–400)
RBC: 3.9 MIL/uL (ref 3.87–5.11)
RDW: 12.9 % (ref 11.5–15.5)
WBC: 3.9 K/uL — ABNORMAL LOW (ref 4.0–10.5)
nRBC: 0 % (ref 0.0–0.2)

## 2023-12-11 LAB — PROTIME-INR
INR: 1 (ref 0.8–1.2)
Prothrombin Time: 13.3 s (ref 11.4–15.2)

## 2023-12-11 LAB — TROPONIN T, HIGH SENSITIVITY
Troponin T High Sensitivity: <15 ng/L (ref 0–19)
Troponin T High Sensitivity: <15 ng/L (ref 0–19)

## 2023-12-11 MED ORDER — HYDRALAZINE HCL 20 MG/ML IJ SOLN
10.0000 mg | Freq: Once | INTRAMUSCULAR | Status: AC
  Administered 2023-12-11: 10 mg via INTRAVENOUS
  Filled 2023-12-11: qty 1

## 2023-12-11 MED ORDER — LABETALOL HCL 5 MG/ML IV SOLN: 10.0000 mg | Freq: Once | INTRAVENOUS | Status: DC

## 2023-12-11 MED ORDER — SODIUM CHLORIDE 0.9 % IV BOLUS
1000.0000 mL | Freq: Once | INTRAVENOUS | Status: AC
  Administered 2023-12-11: 1000 mL via INTRAVENOUS

## 2023-12-11 MED ORDER — METOCLOPRAMIDE HCL 5 MG/ML IJ SOLN
10.0000 mg | Freq: Once | INTRAMUSCULAR | Status: AC
  Administered 2023-12-11: 10 mg via INTRAVENOUS
  Filled 2023-12-11: qty 2

## 2023-12-11 MED ORDER — KETOROLAC TROMETHAMINE 30 MG/ML IJ SOLN
15.0000 mg | Freq: Once | INTRAMUSCULAR | Status: AC
  Administered 2023-12-11: 15 mg via INTRAVENOUS
  Filled 2023-12-11: qty 1

## 2023-12-11 NOTE — ED Triage Notes (Signed)
 Pt via pov from home with recurrent headache, dizziness, and increased BP.  Pt reports that she was seen here on Monday for the same, admitted to [facility] and discharged on Tuesday. Pt states she felt wonderful on Friday and then headache returned today. She reports that her bp has been on the rise as well. Pt a&o x 4; nad noted.

## 2023-12-12 NOTE — Discharge Instructions (Signed)
 You were seen in the emergency room for high blood pressure and a headache Your headache improved after several medications called a migraine cocktail here Your blood pressure improved as well Is important that you call your primary doctor for Sandra Yang tomorrow morning to discuss tighter control of your blood pressure Return to the Emergency Department for chest pain trouble breathing severe headaches or any other concerns Continue taking all previous prescribed medication

## 2023-12-12 NOTE — ED Provider Notes (Signed)
 [facility]GENCY DEPARTMENT AT [facility] Provider Note   CSN: [id number] Arrival date & time: 12/11/23  1952     Patient presents with: Headache and Dizziness   Sandra Yang is a 89 y.o. female.  With a history of hypertension who presents to the ED for headache. Patient was recently seen in the ED on Sep 1 for a headache and subsequently admitted for hyponatremia suspected to be caused by hydrochlorothiazide . She was discharged on Sep 2 back to home. Over the last few days she has been experiencing ongoing frontal headaches and hypertension at home. Some associated dizziness and generalized weakness. Pt is compliant with losartan  50mg  every day. She denies neck pain, fevers, chills, nausea,vomiting chest pain and SOB.    Headache Associated symptoms: dizziness   Dizziness Associated symptoms: headaches        Prior to Admission medications   Medication Sig Start Date End Date Taking? Authorizing Provider  cyanocobalamin  (VITAMIN B12) 1000 MCG/ML injection Inject 1 mL (1,000 mcg total) into the muscle every 14 (fourteen) days for 365 doses. 09/28/23 09/11/37  Suzann Inocente HERO, MD  Ergocalciferol  10 MCG (400 UNIT) TABS Take 1 tablet by mouth daily. 10/28/16   [provider]  losartan  (COZAAR ) 50 MG tablet Take 50 mg by mouth daily. Patient not taking: Reported on 12/06/2023 06/23/23   [provider]  losartan  (COZAAR ) 50 MG tablet Take 1 tablet (50 mg total) by mouth daily. 12/06/23 12/05/24  Elpidio Reyes DEL, MD  mesalamine  (LIALDA ) 1.2 g EC tablet Take 2 tablets (2.4 g total) by mouth in the morning and at bedtime. 04/20/23 12/06/23  Suzann Inocente HERO, MD  MULTIPLE VITAMIN PO Take 1 tablet by mouth daily.    [provider]  omeprazole  (PRILOSEC) 20 MG capsule TAKE ONE CAPSULE BY MOUTH ONE TIME DAILY 03/31/23   Aneita Gwendlyn DASEN, MD    Allergies: Metronidazole , Atorvastatin, Azithromycin, Doxycycline, Erythromycin, Raloxifene, Tetanus immune  globulin, Tetanus toxoid-containing vaccines, Tetanus-diphtheria toxoids td, and Tape    Review of Systems  Neurological:  Positive for dizziness and headaches.    Updated Vital Signs BP (!) 162/56   Pulse 70   Temp 97.8 F (36.6 C) (Oral)   Resp 16   Ht 4' 11 (1.499 m)   Wt 53.8 kg   SpO2 99%   BMI 23.93 kg/m   Physical Exam Vitals and nursing note reviewed.  HENT:     Head: Normocephalic and atraumatic.  Eyes:     Pupils: Pupils are equal, round, and reactive to light.  Cardiovascular:     Rate and Rhythm: Normal rate and regular rhythm.  Pulmonary:     Effort: Pulmonary effort is normal.     Breath sounds: Normal breath sounds.  Abdominal:     Palpations: Abdomen is soft.     Tenderness: There is no abdominal tenderness.  Skin:    General: Skin is warm and dry.  Neurological:     Mental Status: She is alert and oriented to person, place, and time.     Sensory: No sensory deficit.     Motor: No weakness.     Coordination: Coordination normal.  Psychiatric:        Mood and Affect: Mood normal.     (all labs ordered are listed, but only abnormal results are displayed) Labs Reviewed  CBC - Abnormal; Notable for the following components:      Result Value   WBC 3.9 (*)    HCT 34.7 (*)  All other components within normal limits  BASIC METABOLIC PANEL WITH GFR  PROTIME-INR  TROPONIN T, HIGH SENSITIVITY  TROPONIN T, HIGH SENSITIVITY    EKG: EKG Interpretation Date/Time:  Sunday December 11 2023 20:07:14 EDT Ventricular Rate:  53 PR Interval:  178 QRS Duration:  78 QT Interval:  412 QTC Calculation: 386 R Axis:   99  Text Interpretation: Sinus bradycardia Rightward axis Low voltage QRS Borderline ECG When compared with ECG of 05-Dec-2023 22:06, PREVIOUS ECG IS PRESENT Confirmed by Pamella Sharper [phone]) on 12/11/2023 9:17:12 PM  Radiology: CT Head Wo Contrast Result Date: 12/11/2023 EXAM: CT HEAD WITHOUT CONTRAST 12/11/2023 08:44:40 PM TECHNIQUE: CT  of the head was performed without the administration of intravenous contrast. Automated exposure control, iterative reconstruction, and/or weight based adjustment of the mA/kV was utilized to reduce the radiation dose to as low as reasonably achievable. COMPARISON: 12/16/2022 CLINICAL HISTORY: Stroke, follow up; HA HTN. Hypertension with ha. Recent admission to [facility] for stroke workup. FINDINGS: BRAIN AND VENTRICLES: No acute hemorrhage. No evidence of acute infarct. No hydrocephalus. No extra-axial collection. No mass effect or midline shift. Chronic microvascular ischemia and generalized atrophy. ORBITS: No acute abnormality. SINUSES: Postoperative changes about the paranasal sinuses with signs of chronic sinusitis. Chronic opacification of a few left mastoid air cells. SOFT TISSUES AND SKULL: No acute soft tissue abnormality. No skull fracture. IMPRESSION: 1. No acute intracranial abnormality. Electronically signed by: Norman Gatlin MD 12/11/2023 08:48 PM EDT RP Workstation: [id number]   DG Chest 2 View Result Date: 12/11/2023 CLINICAL DATA:  Headache, elevated blood pressure. EXAM: CHEST - 2 VIEW COMPARISON:  Chest radiograph 03/24/2019 FINDINGS: The heart size and mediastinal contours are within normal limits. Both lungs are clear. The visualized skeletal structures are unremarkable. Chronic mild peribronchial thickening. Cholecystectomy clips. IMPRESSION: 1. No active cardiopulmonary disease. 2. Chronic mild peribronchial thickening. Electronically Signed   By: Gilmore GORMAN Molt M.D.   On: 12/11/2023 20:21     Procedures   Medications Ordered in the ED  sodium chloride  0.9 % bolus 1,000 mL (0 mLs Intravenous Stopped 12/12/23 0003)  ketorolac  (TORADOL ) 30 MG/ML injection 15 mg (15 mg Intravenous Given 12/11/23 2153)  metoCLOPramide  (REGLAN ) injection 10 mg (10 mg Intravenous Given 12/11/23 2154)  hydrALAZINE  (APRESOLINE ) injection 10 mg (10 mg Intravenous Given 12/11/23 2258)    Clinical Course as  of 12/12/23 1215  Mon Dec 12, 2023  0008 CT head again unremarkable.  Laboratory workup unremarkable.  No recurrent hyponatremia.  Blood pressure improved to systolic 160.  Headache has resolved.  Will instruct for close PCP follow-up.  She will call tomorrow morning to discuss further management of her blood pressure.  Return precaution discussed with her and her daughter that be worrisome for severe headaches [MP]    Clinical Course User Index [MP] Pamella Sharper LABOR, DO                                 Medical Decision Making 89 yo F with history as above returns for recurrent frontal headache and HTN. Seein one week ago in the ED and admitted for hypoNa and HTN. No focal neuro deficits tonight. Afebrile with initial SBP over 200. Will obtain laboratory workup and ECG to evaluate for evidence of end organ damage. Will provide with migraine cocktail and reevaluate her headache to ascertain if the elevated Bps might be due to the headache. If her BP remains elevated after  migraine cocktail, will consider antihypertensive while here in the ED  Amount and/or Complexity of Data Reviewed Labs: ordered. Radiology: ordered.  Risk Prescription drug management.        Final diagnoses:  Secondary hypertension  Acute nonintractable headache, unspecified headache type    ED Discharge Orders     None          Pamella Ozell LABOR, DO 12/12/23 1215

## 2023-12-13 ENCOUNTER — Other Ambulatory Visit: Payer: Self-pay | Admitting: Registered Nurse

## 2023-12-13 DIAGNOSIS — N182 Chronic kidney disease, stage 2 (mild): Secondary | ICD-10-CM

## 2023-12-13 DIAGNOSIS — R519 Headache, unspecified: Secondary | ICD-10-CM | POA: Diagnosis not present

## 2023-12-13 DIAGNOSIS — I129 Hypertensive chronic kidney disease with stage 1 through stage 4 chronic kidney disease, or unspecified chronic kidney disease: Secondary | ICD-10-CM | POA: Diagnosis not present

## 2023-12-13 DIAGNOSIS — E871 Hypo-osmolality and hyponatremia: Secondary | ICD-10-CM | POA: Diagnosis not present

## 2023-12-13 DIAGNOSIS — E785 Hyperlipidemia, unspecified: Secondary | ICD-10-CM | POA: Diagnosis not present

## 2023-12-13 NOTE — Progress Notes (Unsigned)
 [facility] Return Visit   Referring Provider Yolande Toribio MATSU, MD [address] [place],  [place] [address]  Primary Care Provider Yolande Toribio MATSU, MD  Patient Profile: Sandra Yang is a 89 y.o. female with a past medical history noteworthy for hyperlipidemia,  hypertension. Right colon TVA s/p right hemicolectomy 2001 who returns to the [facility] for follow-up of the problem(s) noted below.  Problem List: Non stricturing, nonpenetrating Crohn's colitis diagnosed 2007 Diarrhea, fecal urgency and incontinence -improved Constipation Gas and bloating Fatigue Personal history of a tubulovillous adenomatous colon polyp - S/P right hemicolectomy 2001 GERD Low vitamin B12  History of Present Illness   Sandra Yang was last seen in the GI office 09/28/2023   Current GI Meds  Lialda  2.4 g p.o. twice daily Hyoscyamine  0.125 mg sublingual every 4 hours as needed for abdominal pain Omeprazole  20 mg orally daily  Interval History  Discussed the use of AI scribe software for clinical note transcription with the patient, who gave verbal consent to proceed.  History of Present Illness Sandra Yang is an 89 year old female with a past medical history noteworthy for HTN, HLD, right colon TVA status post right hemicolectomy 2001 who returns to the gastroenterology office for follow-up of multiple gastrointestinal issues as outlined above.  Crohn's Disease Altered bowel habits and gas - At [facility] visit 02/2023 she endorsed increasing diarrhea for a few months - Laboratory studies including CBC, CMP, TSH, ESR, CRP with normal - Fecal calprotectin elevated at 150; GI stool profile negative, fecal last 2 days 590 - Given elevated fecal calprotectin, she was advised to increase Lialda  from 2.4 g daily to 2.4 g po BID  - Fecal calprotectin 04/2023 was elevated at 121 - She was prescribed a course of budesonide  9 mg, 6 mg, 3 mg - Repeat fecal  calprotectin 07/2023 decreased to 102  - In April 2025 she reported that her loose bowel movements improved that she attributed to limiting forms of cheese in her diet - Endorsed abdominal pain unresponsive to hyoscyamine  - CTE 08/2023 did not show any evidence of inflammation - Initiated trial of cholestyramine  2 g p.o. twice daily 08/2023 for possible bile acid diarrhea secondary to ileocecectomy - states he did not take this  - After visit 09/2023 prescribed metronidazole  for possible SIBO -had itching after 2 doses prompting cessation - Did not start cholestyramine   - At today's visit she reports that her symptoms are improved - Stools are more formed with less frequent diarrhea - One to two bowel movements on good days - Three to four bowel movements on rare days with diarrhea - Occasional urgency with looser stools - No abdominal pain, cramping, nausea, vomiting, blood, or mucus in stools  - Increased gas over the past week - No significant bloating or abdominal distention - Appetite remains good - Weight has been fluctuating between 117 to 122 pounds  - Has had diminished energy for which I recommended continuing vitamin B12 injections  GERD - GERD symptoms are currently well-controlled on omeprazole  20 mg orally daily - Uses Tums once a week for breakthrough symptoms - Denies pyrosis, regurgitation, dysphagia or odynophagia   Last colonoscopy: 03/2015 -no active inflammation, 4 sessile polyps in the descending colon - 3 TAS + benign mucosa Last endoscopy: 09/2003 - normal  Last Abd CT/CTE/MRE: CTAP 04/2017 -no acute abnormality; post partial colonic resection admitted she had difficulty towards the left and small bowel towards the right, pelvic floor laxity with  mild prolapse of bladder and rectum, mild fatty infiltration of liver  GI Review of Symptoms Significant for constipation and abdominal discomfort. Otherwise negative.  General Review of Systems  Review of systems is  significant for the pertinent positives and negatives as listed per the HPI.  Full ROS is otherwise negative.   Inflammatory Bowel Disease History  2007 -diagnosed with Crohn's colitis managed with oral mesalamine   IBD Medication History Oral mesalamine   Past Medical History   Past Medical History:  Diagnosis Date   Allergy    Crohn's colitis (HCC) 10/2008   GERD (gastroesophageal reflux disease)    Hypertension    Tubulovillous adenoma polyp of colon 03/1999    Past Surgical History   Past Surgical History:  Procedure Laterality Date   APPENDECTOMY     BREAST EXCISIONAL BIOPSY Right 2019   CHOLECYSTECTOMY  2001   FOOT SURGERY Left    HAND SURGERY Left    HEMICOLECTOMY  2001   RADIOACTIVE SEED GUIDED EXCISIONAL BREAST BIOPSY Right 04/13/2017   Procedure: RIGHT RADIOACTIVE SEED GUIDED EXCISIONAL BREAST BIOPSY ERAS PATHWAY;  Surgeon: Ebbie Cough, MD;  Location: [facility];  Service: General;  Laterality: Right;  LMA   VAGINAL HYSTERECTOMY     total      Allergies and Medications   Allergies  Allergen Reactions   Metronidazole  Itching and Other (See Comments)    Flushed/hot   Atorvastatin Other (See Comments)    Other Reaction(s): cramps   Azithromycin     Other Reaction(s): itching   Doxycycline Hives   Erythromycin Itching   Raloxifene Other (See Comments)    Other reaction(s): Other (See Comments)  Other Reaction(s): hot flashes   Tetanus Immune Globulin     Other Reaction(s): swelling/fever   Tetanus Toxoid-Containing Vaccines Swelling    Swelling of arm   Tetanus-Diphtheria Toxoids Td     Other Reaction(s): very swollen & red, doctor recommended not to have another tetanus   Tape Rash and Dermatitis    Current Meds  Medication Sig   cyanocobalamin  (VITAMIN B12) 1000 MCG/ML injection Inject 1 mL (1,000 mcg total) into the muscle every 14 (fourteen) days for 365 doses.   Ergocalciferol  10 MCG (400 UNIT) TABS Take 1 tablet by mouth  daily.   losartan  (COZAAR ) 50 MG tablet Take 1 tablet (50 mg total) by mouth daily.   MULTIPLE VITAMIN PO Take 1 tablet by mouth daily.   omeprazole  (PRILOSEC) 20 MG capsule TAKE ONE CAPSULE BY MOUTH ONE TIME DAILY   telmisartan (MICARDIS) 40 MG tablet 1 tablet Orally Once a day; Duration: 30 days     Family History   Family History  Problem Relation Age of Onset   Stroke Mother    Prostate cancer Father    Bone cancer Father    Breast cancer Sister    Colon cancer Neg Hx    Stomach cancer Neg Hx     Social History   Social History   Social History Narrative   Sandra Yang is widowed   She has children who reside in the local area   She is a previous smoker and quit in 1980   Social alcohol  consumption   Employed at [employer] and continue to work 1 day a week   Sandra Yang reports that she quit smoking about 43 years ago. Her smoking use included cigarettes. She has never used smokeless tobacco. She reports current alcohol  use of about 2.0 standard drinks of alcohol  per week. She reports  that she does not use drugs.  Vital Signs and Physical Examination   Vitals:   12/14/23 1519  BP: 136/70  Pulse: 64     Body mass index is 24.04 kg/m. Weight: 119 lb (54 kg)  Constitutional: healthy appearing nontoxic appearing 89 y.o. female in no apparent distress.  Appears younger than stated age Eyes: Clear sclerae Head: Normocephalic/atraumatic Neck: Normal Pulmonary: Clear to auscultation bilaterally CV: S1, S2, regular rate and rhythm GI/Abdomen: Soft, nontender, nondistended, normoactive bowel sounds Anorectal: Deferred Neuro: Nonfocal Skin: Warm, dry, no rash Bilateral Extremities: Normal   Review of Data  The following data was reviewed at the time of this encounter:  Laboratory Studies   Lab Results  Component Value Date   WBC 3.9 (L) 12/11/2023   HGB 12.0 12/11/2023   HCT 34.7 (L) 12/11/2023   MCV 89.0 12/11/2023   PLT 157 12/11/2023      Chemistry      Component Value Date/Time   NA 136 12/11/2023 2054   K 4.0 12/11/2023 2054   CL 103 12/11/2023 2054   CO2 22 12/11/2023 2054   BUN 13 12/11/2023 2054   CREATININE 0.85 12/11/2023 2054      Component Value Date/Time   CALCIUM 9.6 12/11/2023 2054   ALKPHOS 70 12/05/2023 2210   AST 20 12/05/2023 2210   ALT 11 12/05/2023 2210   BILITOT 0.4 12/05/2023 2210      Lab Results  Component Value Date   TSH 1.14 09/28/2023   Lab Results  Component Value Date   CRP <1.0 09/28/2023   Lab Results  Component Value Date   ESRSEDRATE 6 09/28/2023   02/2023  GI stool profile negative Fecal elastase 590 Fecal calprotectin 150  04/2023 Fecal calprotectin 121  07/2023 Fecal calprotectin  102  Imaging Studies  CTE 08/10/2023 No evidence for active or sequelae of chronic inflammatory bowel disease. Prior ileocecectomy.  CTAP 04/2017 No acute abnormality of the abdomen or pelvis.   Post partial colonic resection with mild shift of colon towards the left and small bowel towards the right.   Pelvic floor laxity with mild prolapse urinary bladder and rectum.   Mild fatty infiltration of the liver.   Liver and renal cysts incidentally noted.   Aortic Atherosclerosis (ICD10-I70.0).   Degenerative changes L2-3 through L5-S1.  CTAP 03/2006 Findings: Mild wall thickening in the entire remaining colon, greatest in the descending/left colon noted. No evidence of free fluid, bowel obstruction, or pneumoperitoneum. The remainder of the bowel and bladder are unremarkable. Evidence of prior proximal partial colectomy noted. The visualized portions of the celiac artery, SMA, IMA, and SMV are patent.   IMPRESSION:  Colitis, greatest in the left colon - ischemia not excluded.     GI Procedures and Studies  Colonoscopy 03/18/2015 No active inflammation, 4 sessile polyps in the descending colon - 3TAS + benign mucosa  Additional previous endoscopic studies were reviewed in  EPIC  Clinical Impression  It is my clinical impression that Sandra Yang is a 89 y.o. female with;  Non stricturing, nonpenetrating Crohn's colitis diagnosed 2007 Diarrhea, fecal urgency and incontinence -improved Constipation Gas and bloating Fatigue Personal history of a tubulovillous adenomatous colon polyp - S/P right hemicolectomy 2001 GERD Low vitamin B12  Sandra Yang was diagnosed with stricturing, non penetrating Crohn's colitis in 2007.  She has primarily been managed with oral mesalamine .  At the time of her clinic visit 02/2023 she endorsed increasing symptoms of diarrhea.  Laboratory testing at that time was  unremarkable with the exception of elevated fecal calprotectin 150.  Stool pathogen panel was negative.  She was advised to increase Lialda  from 2.4 g daily to 2.4 g p.o. twice daily.  She reports that this resulted in improvement in her symptoms.  In January 2025, Sandra Yang reported that her diarrhea and bloating better than in the fall but she continues to experience intermittent loose stool, fecal urgency and rare episodes of incontinence.  These episodes of loose stool were punctuated by days of normal bowel movements.  Fecal calprotectin was mildly elevated at 121.  As such I recommended a trial of budesonide .  Sandra Yang did not feel that budesonide  made a significant difference.  She had some reduction in diarrhea with dietary modification limiting cheese/dairy.  Repeat fecal calprotectin had decreased to 102.  This was followed up with CT ED imaging in May 2025 that was normal without any evidence of active Crohn's disease.  As she was reporting ongoing diarrhea I suggested a trial of cholestyramine  for possible bile acid diarrhea related to her previous right hemicolectomy.  She did not start cholestyramine  out of concern for possible side effects.  At [facility] last visit we discussed that her symptoms of abdominal discomfort and bloating could be related to SIBO, IBS, celiac  disease, nonceliac gluten sensitivity, a carbohydrate intolerance.  Fatigue could be related to vitamin deficiencies.  Discussed that her recent CT imaging was reassuring in the sense that no other significant pathologies were identified such as malignancies or gynecologic issues.  Labs ruled out thyroid  disease and celiac disease.  Vitamin B12 was slightly low and I recommended supplementation.  Advised a trial of metronidazole  for possible SIBO but she developed an itching reaction after taking 2 doses and subsequently stopped the medication  Today, Sandra Yang reports that her bowel symptoms are significantly better.  She reports that most of her bowel movements are normal although she did have a loose stool this morning.  Her daughter concurs that she overall seems to be in better stead.  Unclear if short course of Flagyl  could have rectified some degree of SIBO.  Plan  Continue Lialda  2.4 g daily Periodic fecal calprotectin assessments for disease activity monitoring related to Crohn's disease. For bloating symptoms consider Gas-X, Beano, low FODMAP diet, probiotics, yogurt If bloating persists in the future consider KUB to rule out constipation as a contributor to pain and bloating If SIBO is a concern in the future can consider trial of antibiotic other than metronidazole  to which she had a reaction.  Insurance did not cover Xifaxan .  Augmentin may be a consideration. Discussed trials of Gas-X and Beano Continue omeprazole  20 mg orally daily, antireflux modalities Tums as needed for breakthrough GERD Monitor weight anthropometrics Continue vitamin B12 1000 mcg SQ/IM monthly  IBD Health Maintenance  Vaccinations Influenza: PCV13: PPSV23: COVID19: HAV/HBV: Shingles: HPV:  DEXA   Pap Smear   Eye Exam PRN  Skin Exam N/A  Surveillance Colonoscopy Ceased due to age  Tobacco Use None  Depression Screen     Planned Follow Up 6 months  The patient or caregiver verbalized understanding  of the material covered, with no barriers to understanding. All questions were answered. Patient or caregiver is agreeable with the plan outlined above.    It was a pleasure to see Sandra Yang.  If you have any questions or concerns regarding this evaluation, do not hesitate to contact me.  Inocente Hausen, MD [facility]   I spent total of 30 minutes in both face-to-face (20  minutes interview) and non-face-to-face (10 minutes chart review, care coordination, documentation)  activities, excluding procedures performed, for the visit on the date of this encounter.

## 2023-12-14 ENCOUNTER — Ambulatory Visit: Admitting: Pediatrics

## 2023-12-14 ENCOUNTER — Encounter: Payer: Self-pay | Admitting: Pediatrics

## 2023-12-14 VITALS — BP 136/70 | HR 64 | Ht 59.0 in | Wt 119.0 lb

## 2023-12-14 DIAGNOSIS — R140 Abdominal distension (gaseous): Secondary | ICD-10-CM | POA: Diagnosis not present

## 2023-12-14 DIAGNOSIS — R109 Unspecified abdominal pain: Secondary | ICD-10-CM | POA: Diagnosis not present

## 2023-12-14 DIAGNOSIS — K50118 Crohn's disease of large intestine with other complication: Secondary | ICD-10-CM

## 2023-12-14 DIAGNOSIS — K219 Gastro-esophageal reflux disease without esophagitis: Secondary | ICD-10-CM

## 2023-12-14 DIAGNOSIS — R7989 Other specified abnormal findings of blood chemistry: Secondary | ICD-10-CM

## 2023-12-14 DIAGNOSIS — R141 Gas pain: Secondary | ICD-10-CM

## 2023-12-14 DIAGNOSIS — K50119 Crohn's disease of large intestine with unspecified complications: Secondary | ICD-10-CM

## 2023-12-14 NOTE — Patient Instructions (Signed)
 Follow up in 6 months or earlier if needed.  Thank you for entrusting me with your care and for choosing [facility], Dr. Inocente Hausen  _______________________________________________________  If your blood pressure at your visit was 140/90 or greater, please contact your primary care physician to follow up on this.  _______________________________________________________  If you are age 88 or older, your body mass index should be between 23-30. Your Body mass index is 24.04 kg/m. If this is out of the aforementioned range listed, please consider follow up with your Primary Care Provider.  If you are age 7 or younger, your body mass index should be between 19-25. Your Body mass index is 24.04 kg/m. If this is out of the aformentioned range listed, please consider follow up with your Primary Care Provider.   ________________________________________________________  The [facility] providers would like to encourage you to use MYCHART to communicate with providers for non-urgent requests or questions.  Due to long hold times on the telephone, sending your provider a message by [facility] may be a faster and more efficient way to get a response.  Please allow 48 business hours for a response.  Please remember that this is for non-urgent requests.  _______________________________________________________  [facility] is using a team-based approach to care.  Your team is made up of your doctor and two to three APPS. Our APPS (Nurse Practitioners and Physician Assistants) work with your physician to ensure care continuity for you. They are fully qualified to address your health concerns and develop a treatment plan. They communicate directly with your gastroenterologist to care for you. Seeing the Advanced Practice Practitioners on your physician's team can help you by facilitating care more promptly, often allowing for earlier appointments, access to diagnostic testing, procedures,  and other specialty referrals.

## 2023-12-15 ENCOUNTER — Other Ambulatory Visit

## 2023-12-19 ENCOUNTER — Ambulatory Visit (HOSPITAL_COMMUNITY)
Admission: RE | Admit: 2023-12-19 | Discharge: 2023-12-19 | Disposition: A | Discharge status: Home / Self Care | Source: Ambulatory Visit | Attending: Surgery | Admitting: Surgery

## 2023-12-19 DIAGNOSIS — I129 Hypertensive chronic kidney disease with stage 1 through stage 4 chronic kidney disease, or unspecified chronic kidney disease: Secondary | ICD-10-CM | POA: Insufficient documentation

## 2023-12-19 DIAGNOSIS — N182 Chronic kidney disease, stage 2 (mild): Secondary | ICD-10-CM | POA: Diagnosis not present

## 2023-12-20 ENCOUNTER — Other Ambulatory Visit: Payer: Self-pay | Admitting: Pediatrics

## 2023-12-21 ENCOUNTER — Other Ambulatory Visit

## 2023-12-26 ENCOUNTER — Ambulatory Visit: Admitting: Cardiology

## 2023-12-26 DIAGNOSIS — I129 Hypertensive chronic kidney disease with stage 1 through stage 4 chronic kidney disease, or unspecified chronic kidney disease: Secondary | ICD-10-CM | POA: Diagnosis not present

## 2023-12-26 DIAGNOSIS — N182 Chronic kidney disease, stage 2 (mild): Secondary | ICD-10-CM | POA: Diagnosis not present

## 2023-12-26 DIAGNOSIS — E871 Hypo-osmolality and hyponatremia: Secondary | ICD-10-CM | POA: Diagnosis not present

## 2023-12-27 ENCOUNTER — Ambulatory Visit: Admitting: Pediatrics

## 2023-12-28 DIAGNOSIS — I129 Hypertensive chronic kidney disease with stage 1 through stage 4 chronic kidney disease, or unspecified chronic kidney disease: Secondary | ICD-10-CM | POA: Diagnosis not present

## 2023-12-28 DIAGNOSIS — E871 Hypo-osmolality and hyponatremia: Secondary | ICD-10-CM | POA: Diagnosis not present

## 2023-12-28 DIAGNOSIS — N182 Chronic kidney disease, stage 2 (mild): Secondary | ICD-10-CM | POA: Diagnosis not present

## 2023-12-30 ENCOUNTER — Emergency Department (HOSPITAL_BASED_OUTPATIENT_CLINIC_OR_DEPARTMENT_OTHER)
Admission: EM | Admit: 2023-12-30 | Discharge: 2023-12-30 | Disposition: A | Discharge status: Home / Self Care | Attending: Emergency Medicine | Admitting: Emergency Medicine

## 2023-12-30 DIAGNOSIS — I10 Essential (primary) hypertension: Secondary | ICD-10-CM | POA: Insufficient documentation

## 2023-12-30 DIAGNOSIS — I160 Hypertensive urgency: Secondary | ICD-10-CM

## 2023-12-30 LAB — CBC
HCT: 38.7 % (ref 36.0–46.0)
Hemoglobin: 13 g/dL (ref 12.0–15.0)
MCH: 30.3 pg (ref 26.0–34.0)
MCHC: 33.6 g/dL (ref 30.0–36.0)
MCV: 90.2 fL (ref 80.0–100.0)
Platelets: 177 K/uL (ref 150–400)
RBC: 4.29 MIL/uL (ref 3.87–5.11)
RDW: 12.9 % (ref 11.5–15.5)
WBC: 3.6 K/uL — ABNORMAL LOW (ref 4.0–10.5)
nRBC: 0 % (ref 0.0–0.2)

## 2023-12-30 LAB — BASIC METABOLIC PANEL WITH GFR
Anion gap: 13 (ref 5–15)
BUN: 11 mg/dL (ref 8–23)
CO2: 23 mmol/L (ref 22–32)
Calcium: 9.7 mg/dL (ref 8.9–10.3)
Chloride: 100 mmol/L (ref 98–111)
Creatinine, Ser: 0.72 mg/dL (ref 0.44–1.00)
GFR, Estimated: >60 mL/min (ref >60)
Glucose, Bld: 89 mg/dL (ref 70–99)
Potassium: 4.1 mmol/L (ref 3.5–5.1)
Sodium: 136 mmol/L (ref 135–145)

## 2023-12-30 LAB — TROPONIN T, HIGH SENSITIVITY: Troponin T High Sensitivity: <15 ng/L (ref 0–19)

## 2023-12-30 MED ORDER — HYDRALAZINE HCL 20 MG/ML IJ SOLN
10.0000 mg | Freq: Once | INTRAMUSCULAR | Status: AC
Start: 2023-12-30 — End: 2023-12-30
  Administered 2023-12-30: 10 mg via INTRAVENOUS
  Filled 2023-12-30: qty 1

## 2023-12-30 NOTE — ED Notes (Signed)
 ED Provider at bedside.

## 2023-12-30 NOTE — ED Notes (Signed)
 Reviewed discharge instructions, medications, and home care with pt. Pt verbalized understanding and had no further questions. Pt exited ED without complications.

## 2023-12-30 NOTE — ED Provider Notes (Signed)
 [place] [facility] AT [facility] Provider Note   CSN: [id number] Arrival date & time: 12/30/23  1657     Patient presents with: Hypertension   Sandra Yang is a 89 y.o. female.   Patient has a past medical history of hypertension, hyperlipidemia who came in with a c/o high blood pressure today.  She was accompanied by daughter who mentioned that patient takes telmisartan in the morning and amlodipine  at night.  However, patient mistakenly took another telmisartan last night instead of her amlodipine .  Patient's blood pressure was elevated with systolics in the 170s this a.m. which went  over 200s later in the afternoon.  Patient also developed numbness and tingling in the lateral left forearm today.  Pt denied any headaches, vision changes, chest pain, shortness of breath, fevers.  She denies pain anywhere in the body.  She denies any weakness in strength in her upper or lower extremities.  Daughter said patient did not take her blood pressure medications today as she was concerned about patient's numbness and tingling of left arm.  Daughter said that patient was prescribed hydralazine  as needed if systolics went over 180.  However, pt did not take hydralazine  today and was brought into the ED.    Hypertension       Prior to Admission medications   Medication Sig Start Date End Date Taking? Authorizing Provider  cyanocobalamin  (VITAMIN B12) 1000 MCG/ML injection Inject 1 mL (1,000 mcg total) into the muscle every 14 (fourteen) days for 365 doses. 09/28/23 09/11/37  Suzann Inocente HERO, MD  Ergocalciferol  10 MCG (400 UNIT) TABS Take 1 tablet by mouth daily. 10/28/16   [provider]  losartan  (COZAAR ) 50 MG tablet Take 50 mg by mouth daily. Patient not taking: Reported on 12/06/2023 06/23/23   [provider]  losartan  (COZAAR ) 50 MG tablet Take 1 tablet (50 mg total) by mouth daily. 12/06/23 12/05/24  Elpidio Reyes DEL, MD  mesalamine  (LIALDA ) 1.2 g EC  tablet Take 2 tablets (2.4 g total) by mouth in the morning and at bedtime. 12/20/23   Suzann Inocente HERO, MD  MULTIPLE VITAMIN PO Take 1 tablet by mouth daily.    [provider]  omeprazole  (PRILOSEC) 20 MG capsule TAKE ONE CAPSULE BY MOUTH ONE TIME DAILY 03/31/23   Aneita Gwendlyn DASEN, MD  telmisartan (MICARDIS) 40 MG tablet 1 tablet Orally Once a day; Duration: 30 days 12/13/23   [provider]    Allergies: Metronidazole , Atorvastatin, Azithromycin, Doxycycline, Erythromycin, Raloxifene, Tetanus immune globulin, Tetanus toxoid-containing vaccines, Tetanus-diphtheria toxoids td, and Tape    Review of Systems  Reason unable to perform ROS: Pertinent ROS in HPI, MDM.    Updated Vital Signs BP (!) 211/68 (BP Location: Right Arm)   Pulse (!) 58   Temp 97.9 F (36.6 C) (Oral)   Resp 17   SpO2 100%   Physical Exam HENT:     Head: Normocephalic.  Eyes:     Extraocular Movements: Extraocular movements intact.     Comments: No nystagmus noted  Cardiovascular:     Rate and Rhythm: Regular rhythm.     Comments: Slightly bradycardic; +2 PT pulses BL Pulmonary:     Effort: Pulmonary effort is normal.     Breath sounds: Normal breath sounds.  Musculoskeletal:     Comments: Tinel's test negative on left UE  Neurological:     General: No focal deficit present.     Mental Status: She is alert and oriented to person,  place, and time. Mental status is at baseline.     Sensory: No sensory deficit.     Motor: No weakness.  Psychiatric:        Behavior: Behavior normal.     (all labs ordered are listed, but only abnormal results are displayed) Labs Reviewed  CBC - Abnormal; Notable for the following components:      Result Value   WBC 3.6 (*)    All other components within normal limits  BASIC METABOLIC PANEL WITH GFR  TROPONIN T, HIGH SENSITIVITY    EKG: None  Radiology: No results found.   Procedures  None Medications Ordered in the ED  hydrALAZINE   (APRESOLINE ) injection 10 mg (has no administration in time range)                                   Medical Decision Making Patient came in with a chief complaint of elevated blood pressure and some numbness and tingling in her right hand.  Her blood pressure was over 200 systolic when she came in.  Differential diagnosis includes hypertensive urgency, stroke, hypothyroidism.  Patient had missed her amlodipine  dose last night and all HTN medication doses today.  She was also told to take hydralazine  3 times a day if her systolic blood pressure went over 180 by one of her medical providers.  However, patient did not take hydralazine  today.  Patient's lab results were unremarkable. Physical exam findings are negative for stroke.   Pt's symptoms are likely due to missed medications for HTN.  Her blood pressure went down to 170 systolic after administering hydralazine .  Patient was asked to take her blood pressure medications appropriately as prescribed by her provider.  She was stable to be discharged  Disposition: Patient stable and discharged  Amount and/or Complexity of Data Reviewed Labs: ordered.    Details: CBC, BMP, troponin  Risk Prescription drug management.      Final diagnoses:  None    ED Discharge Orders     None          Edgardo Pontiff, DO 12/30/23 2023    Pamella Ozell LABOR, DO 01/01/24 2003

## 2023-12-30 NOTE — ED Triage Notes (Signed)
 Patient states high blood pressure at home. Patient reports tingling to left arm but sensation intact in triage. VAN negative. No deficits. Patient states did not take BP medication today because she accidentally took an extra dose last night.

## 2023-12-30 NOTE — ED Notes (Signed)
 Family at bedside.

## 2023-12-30 NOTE — Discharge Instructions (Addendum)
 You came in due to high blood pressure.  Hydralazine  was given and blood pressure came down to 170s systolic.  Please take all home blood pressure medications and hydralazine  as was prescribed by your PCP/cardiologist.   Please go to the ED if you have excruciating headache, vision changes, or worsening of symptoms.

## 2024-01-04 DIAGNOSIS — N182 Chronic kidney disease, stage 2 (mild): Secondary | ICD-10-CM | POA: Diagnosis not present

## 2024-01-04 DIAGNOSIS — E871 Hypo-osmolality and hyponatremia: Secondary | ICD-10-CM | POA: Diagnosis not present

## 2024-01-04 DIAGNOSIS — I129 Hypertensive chronic kidney disease with stage 1 through stage 4 chronic kidney disease, or unspecified chronic kidney disease: Secondary | ICD-10-CM | POA: Diagnosis not present

## 2024-01-11 ENCOUNTER — Encounter: Payer: Self-pay | Admitting: Registered Nurse

## 2024-01-11 ENCOUNTER — Other Ambulatory Visit: Payer: Self-pay | Admitting: Registered Nurse

## 2024-01-11 DIAGNOSIS — N182 Chronic kidney disease, stage 2 (mild): Secondary | ICD-10-CM | POA: Diagnosis not present

## 2024-01-11 DIAGNOSIS — R519 Headache, unspecified: Secondary | ICD-10-CM | POA: Diagnosis not present

## 2024-01-11 DIAGNOSIS — E871 Hypo-osmolality and hyponatremia: Secondary | ICD-10-CM | POA: Diagnosis not present

## 2024-01-11 DIAGNOSIS — R6883 Chills (without fever): Secondary | ICD-10-CM | POA: Diagnosis not present

## 2024-01-11 DIAGNOSIS — I129 Hypertensive chronic kidney disease with stage 1 through stage 4 chronic kidney disease, or unspecified chronic kidney disease: Secondary | ICD-10-CM | POA: Diagnosis not present

## 2024-01-11 DIAGNOSIS — R4189 Other symptoms and signs involving cognitive functions and awareness: Secondary | ICD-10-CM

## 2024-01-11 DIAGNOSIS — R1032 Left lower quadrant pain: Secondary | ICD-10-CM | POA: Diagnosis not present

## 2024-01-11 DIAGNOSIS — N390 Urinary tract infection, site not specified: Secondary | ICD-10-CM | POA: Diagnosis not present

## 2024-01-13 ENCOUNTER — Encounter: Payer: Self-pay | Admitting: Registered Nurse

## 2024-01-16 ENCOUNTER — Other Ambulatory Visit

## 2024-01-25 ENCOUNTER — Ambulatory Visit
Admission: RE | Admit: 2024-01-25 | Discharge: 2024-01-25 | Disposition: A | Discharge status: Home / Self Care | Source: Ambulatory Visit | Attending: Registered Nurse | Admitting: Registered Nurse

## 2024-01-25 DIAGNOSIS — R4189 Other symptoms and signs involving cognitive functions and awareness: Secondary | ICD-10-CM

## 2024-01-25 DIAGNOSIS — G44319 Acute post-traumatic headache, not intractable: Secondary | ICD-10-CM | POA: Diagnosis not present

## 2024-01-25 DIAGNOSIS — R519 Headache, unspecified: Secondary | ICD-10-CM

## 2024-01-25 MED ORDER — GADOPICLENOL 0.5 MMOL/ML IV SOLN
6.0000 mL | Freq: Once | INTRAVENOUS | Status: AC | PRN
  Administered 2024-01-25: 6 mL via INTRAVENOUS

## 2024-01-30 DIAGNOSIS — Z23 Encounter for immunization: Secondary | ICD-10-CM | POA: Diagnosis not present

## 2024-02-08 ENCOUNTER — Ambulatory Visit: Attending: Cardiology | Admitting: Cardiology

## 2024-02-08 ENCOUNTER — Ambulatory Visit: Admitting: Cardiology

## 2024-02-08 ENCOUNTER — Ambulatory Visit

## 2024-02-08 ENCOUNTER — Encounter: Payer: Self-pay | Admitting: Cardiology

## 2024-02-08 VITALS — BP 172/60 | HR 74 | Ht 59.0 in | Wt 116.0 lb

## 2024-02-08 DIAGNOSIS — R002 Palpitations: Secondary | ICD-10-CM | POA: Insufficient documentation

## 2024-02-08 DIAGNOSIS — R600 Localized edema: Secondary | ICD-10-CM | POA: Insufficient documentation

## 2024-02-08 DIAGNOSIS — I10 Essential (primary) hypertension: Secondary | ICD-10-CM | POA: Diagnosis not present

## 2024-02-08 NOTE — Patient Instructions (Signed)
 Medication Instructions:  Your physician recommends that you continue on your current medications as directed. Please refer to the Current Medication list given to you today.  *If you need a refill on your cardiac medications before your next appointment, please call your pharmacy*  Lab Work: Today- Pro BNP If you have labs (blood work) drawn today and your tests are completely normal, you will receive your results only by: MyChart Message (if you have MyChart) OR A paper copy in the mail If you have any lab test that is abnormal or we need to change your treatment, we will call you to review the results.  Testing/Procedures: Your physician has requested that you have an echocardiogram. Echocardiography is a painless test that uses sound waves to create images of your heart. It provides your doctor with information about the size and shape of your heart and how well your heart's chambers and valves are working. This procedure takes approximately one hour. There are no restrictions for this procedure. Please do NOT wear cologne, perfume, aftershave, or lotions (deodorant is allowed). Please arrive 15 minutes prior to your appointment time.  Please note: We ask at that you not bring children with you during ultrasound (echo/ vascular) testing. Due to room size and safety concerns, children are not allowed in the ultrasound rooms during exams. Our front office staff cannot provide observation of children in our lobby area while testing is being conducted. An adult accompanying a patient to their appointment will only be allowed in the ultrasound room at the discretion of the ultrasound technician under special circumstances. We apologize for any inconvenience.      ZIO XT- Long Term Monitor Instructions  Your physician has requested you wear a ZIO patch monitor for 14 days.  This is a single patch monitor. [employer] supplies one patch monitor per enrollment. Additional stickers are not available.  Please do not apply patch if you will be having a Nuclear Stress Test,  Echocardiogram, Cardiac CT, MRI, or Chest Xray during the period you would be wearing the  monitor. The patch cannot be worn during these tests. You cannot remove and re-apply the  ZIO XT patch monitor.   Billing and Patient Assistance Program Information  We have supplied [employer] with any of your insurance information on file for billing purposes. [employer] offers a sliding scale Patient Assistance Program for patients that do not have  insurance, or whose insurance does not completely cover the cost of the ZIO monitor.  You must apply for the Patient Assistance Program to qualify for this discounted rate.  To apply, please call [employer] at [phone], select option 4, select option 2, ask to apply for  Patient Assistance Program. Meredeth will ask your household income, and how many people  are in your household. They will quote your out-of-pocket cost based on that information.  [employer] will also be able to set up a 26-month, interest-free payment plan if needed.   When you are ready to remove the patch, follow instructions on the last 2 pages of Patient  Logbook. Stick patch monitor onto the last page of Patient Logbook.  Place Patient Logbook in the blue and white box. Use locking tab on box and tape box closed  securely. The blue and white box has prepaid postage on it. Please place it in the mailbox as  soon as possible. Your physician should have your test results approximately 7 days after the  monitor has been mailed back to [facility].  Call [employer]  Customer Care at [phone] if you have questions regarding  your ZIO XT patch monitor. Call them immediately if you see an orange light blinking on your  monitor.  If your monitor falls off in less than 4 days, contact our Monitor department at [phone].  If your monitor becomes loose or falls off after 4 days call [employer] at [phone] for   suggestions on securing your monitor   Follow-Up: At [facility], you and your health needs are our priority.  As part of our continuing mission to provide you with exceptional heart care, our providers are all part of one team.  This team includes your primary Cardiologist (physician) and Advanced Practice Providers or APPs (Physician Assistants and Nurse Practitioners) who all work together to provide you with the care you need, when you need it.  Your next appointment:   6 week(s) post echo  Provider:   One of our Advanced Practice Providers (APPs): Morse Clause, PA-C  Lamarr Satterfield, NP Miriam Shams, NP  Olivia Pavy, PA-C Josefa Beauvais, NP  Leontine Salen, PA-C Orren Fabry, PA-C  [place], PA-C Ernest Dick, NP  Damien Braver, NP Jon Hails, PA-C  Waddell Donath, PA-C    Dayna Dunn, PA-C  Scott Weaver, PA-C Lum Louis, NP Katlyn West, NP Callie Goodrich, PA-C  Xika Zhao, NP Sheng Haley, PA-C    Kathleen Johnson, PA-C   Then, Newman JINNY Lawrence, MD will plan to see you again in 6 month(s).    We recommend signing up for the patient portal called MyChart.  Sign up information is provided on this After Visit Summary.  MyChart is used to connect with patients for Virtual Visits (Telemedicine).  Patients are able to view lab/test results, encounter notes, upcoming appointments, etc.  Non-urgent messages can be sent to your provider as well.   To learn more about what you can do with MyChart, go to [email or web address].

## 2024-02-08 NOTE — Progress Notes (Signed)
 Cardiology Office Note:  .   Date:  02/08/2024  ID:  Sandra Yang, DOB 08-01-1933, MRN [id number] PCP: Yolande Toribio MATSU, MD  [facility] Providers Cardiologist:  Newman Lawrence, MD PCP: Yolande Toribio MATSU, MD  Chief Complaint  Patient presents with   Hypertension     Sandra Yang is a 89 y.o. female with hypertension, hyperlipidemia, h/o Crohn's disease  Discussed the use of AI scribe software for clinical note transcription with the patient, who gave verbal consent to proceed.  History of Present Illness Sandra Yang is an 89 year old female with hypertension who presents for evaluation of blood pressure management. She was referred by her primary doctor for evaluation of her blood pressure management.  Her blood pressure fluctuates, with readings typically in the 130s to 140s, occasionally reaching 225/120 mmHg. She monitors her blood pressure at home twice daily. Her medication regimen includes telmisartan 80 mg in the morning, amlodipine  5 mg at night, and hydralazine  50 mg three times a day. She notes some improvement with these changes. Previously, hydrochlorothiazide  caused significant swelling and confusion, leading to hyponatremia and hospitalization. Since discontinuation, her blood pressure is more stable but still fluctuates, especially in the evenings.  She experiences episodes of nervousness and anxiety, referred to as 'spells,' during which her blood pressure increases and she feels weak. Her pulse was previously in the 40s but is currently stable. She has mild shortness of breath with activity and a new cough over the last few days, attributed to sinus issues.  She lives independently, works one day a week, and engages in walking as her main physical activity. She consumes social wine, does not smoke, and has limited caffeine intake. She is reducing her salt intake and wears compression garments for leg swelling, more pronounced in the left leg. An  MRI showed a punctate enhancing focus in the dorsal left paramedian pons, with follow-up imaging planned. A renal ultrasound indicated possible narrowing on the left side and a cyst.      Vitals:   02/08/24 1517  BP: (!) 172/60  Pulse: 74  SpO2: 98%      Review of Systems  Cardiovascular:  Positive for dyspnea on exertion and leg swelling. Negative for chest pain, palpitations and syncope.        Studies Reviewed: Sandra Yang         Labs Dec 20, 2023: Chol 176, TG 106, HDL 76, LDL 79 Hb 13 Cr 0.72 Trop HS <15 TSH 1.1   Physical Exam Vitals and nursing note reviewed.  Constitutional:      General: She is not in acute distress. Neck:     Vascular: No JVD.  Cardiovascular:     Rate and Rhythm: Normal rate and regular rhythm.     Heart sounds: Normal heart sounds. No murmur heard. Pulmonary:     Effort: Pulmonary effort is normal.     Breath sounds: Normal breath sounds. No wheezing or rales.  Musculoskeletal:     Right lower leg: Edema (1+) present.     Left lower leg: Edema (1+) present.      VISIT DIAGNOSES:   ICD-10-CM   1. Primary hypertension  I10 ECHOCARDIOGRAM COMPLETE    2. Palpitations  R00.2 LONG TERM MONITOR (3-14 DAYS)    3. Leg edema  R60.0 Pro b natriuretic peptide (BNP)    CANCELED: Pro b natriuretic peptide (BNP)       Sandra Yang is a 89 y.o. female with hypertension, hyperlipidemia, h/o  Crohn's disease  Assessment & Plan Primary hypertension: Hypertension managed with telmisartan, amlodipine , and hydralazine . Blood pressure remains elevated, especially in the evenings. Edema likely due to high salt intake and amlodipine . Hyponatremia resolved after discontinuing hydrochlorothiazide . - Order echocardiogram to assess heart function. - Order  proBNP to evaluate for heart failure congestion. - Advise reduction of salt intake, avoiding high sodium foods such as soups, canned foods, fries, chips, pickles, soy sauce, and Asian food. - Consider  starting furosemide after the auction, with monitoring of sodium levels one week after initiation. - Consider increasing hydralazine  to 100 mg three times daily if blood pressure remains elevated, but first focus on reducing salt intake. - Schedule follow-up in four weeks to review test results and blood pressure management.  Mild left renal artery stenosis: Mild stenosis of the left renal artery noted on previous imaging. Not considered significantly abnormal and unlikely to be the primary cause of hypertension. - Continue current management and monitor for any changes.  Palpitations: Reports of palpitations and anxiety episodes daily. Differential includes anxiety-related symptoms versus abnormal heart rhythm. A two-week heart monitor is planned to evaluate for any significant arrhythmias. - Apply a two-week heart monitor to assess for abnormal heart rhythms. - Instruct to avoid showering for the first 24 hours after monitor application, then normal showering is allowed, but avoid pools or bathtubs.        F/u in 6 weeks w/APP, 6 months w/m3  Signed, Newman JINNY Lawrence, MD

## 2024-02-08 NOTE — Progress Notes (Unsigned)
 ZIO serial # [id number] from office inventory applied to patient.

## 2024-02-09 ENCOUNTER — Ambulatory Visit: Payer: Self-pay | Admitting: Cardiology

## 2024-02-09 LAB — PRO B NATRIURETIC PEPTIDE: NT-Pro BNP: 380 pg/mL (ref 0–738)

## 2024-02-09 NOTE — Progress Notes (Signed)
 Marker of congestion is normal.  Okay to hold off adding Lasix for now.  Continue working on salt reduction.  Thanks MJP

## 2024-03-21 ENCOUNTER — Ambulatory Visit (HOSPITAL_COMMUNITY): Admission: RE | Admit: 2024-03-21 | Discharge: 2024-03-21 | Attending: Cardiology | Admitting: Cardiology

## 2024-03-21 DIAGNOSIS — I10 Essential (primary) hypertension: Secondary | ICD-10-CM | POA: Insufficient documentation

## 2024-03-21 LAB — ECHOCARDIOGRAM COMPLETE
AR max vel: 2.03 cm2
AV Area VTI: 1.65 cm2
AV Area mean vel: 2 cm2
AV Mean grad: 4 mmHg
AV Peak grad: 8.8 mmHg
Ao pk vel: 1.48 m/s
Area-P 1/2: 3.43 cm2
S' Lateral: 2.2 cm

## 2024-03-21 NOTE — Progress Notes (Signed)
 Mild left atrial enlargement, mild MR, otherwise normal echocardiogram seeing you next week.  Thanks MJP

## 2024-03-23 ENCOUNTER — Other Ambulatory Visit: Payer: Self-pay

## 2024-03-23 ENCOUNTER — Emergency Department (HOSPITAL_BASED_OUTPATIENT_CLINIC_OR_DEPARTMENT_OTHER)
Admission: EM | Admit: 2024-03-23 | Discharge: 2024-03-23 | Disposition: A | Discharge status: Home / Self Care | Attending: Emergency Medicine | Admitting: Emergency Medicine

## 2024-03-23 DIAGNOSIS — I10 Essential (primary) hypertension: Secondary | ICD-10-CM | POA: Diagnosis not present

## 2024-03-23 DIAGNOSIS — R531 Weakness: Secondary | ICD-10-CM | POA: Insufficient documentation

## 2024-03-23 DIAGNOSIS — R410 Disorientation, unspecified: Secondary | ICD-10-CM | POA: Diagnosis not present

## 2024-03-23 DIAGNOSIS — R638 Other symptoms and signs concerning food and fluid intake: Secondary | ICD-10-CM | POA: Diagnosis not present

## 2024-03-23 DIAGNOSIS — Z79899 Other long term (current) drug therapy: Secondary | ICD-10-CM | POA: Insufficient documentation

## 2024-03-23 LAB — URINALYSIS, ROUTINE W REFLEX MICROSCOPIC
Bacteria, UA: NONE SEEN
Bilirubin Urine: NEGATIVE
Glucose, UA: NEGATIVE mg/dL
Hgb urine dipstick: NEGATIVE
Ketones, ur: NEGATIVE mg/dL
Nitrite: NEGATIVE
Protein, ur: NEGATIVE mg/dL
Specific Gravity, Urine: 1.007 (ref 1.005–1.030)
pH: 5.5 (ref 5.0–8.0)

## 2024-03-23 LAB — COMPREHENSIVE METABOLIC PANEL WITH GFR
ALT: 17 U/L (ref 0–44)
AST: 23 U/L (ref 15–41)
Albumin: 4.3 g/dL (ref 3.5–5.0)
Alkaline Phosphatase: 85 U/L (ref 38–126)
Anion gap: 10 (ref 5–15)
BUN: 16 mg/dL (ref 8–23)
CO2: 23 mmol/L (ref 22–32)
Calcium: 10.1 mg/dL (ref 8.9–10.3)
Chloride: 107 mmol/L (ref 98–111)
Creatinine, Ser: 0.92 mg/dL (ref 0.44–1.00)
GFR, Estimated: 60 mL/min — ABNORMAL LOW
Glucose, Bld: 98 mg/dL (ref 70–99)
Potassium: 4.6 mmol/L (ref 3.5–5.1)
Sodium: 140 mmol/L (ref 135–145)
Total Bilirubin: 0.6 mg/dL (ref 0.0–1.2)
Total Protein: 7 g/dL (ref 6.5–8.1)

## 2024-03-23 LAB — CBC
HCT: 38.6 % (ref 36.0–46.0)
Hemoglobin: 13.1 g/dL (ref 12.0–15.0)
MCH: 30 pg (ref 26.0–34.0)
MCHC: 33.9 g/dL (ref 30.0–36.0)
MCV: 88.5 fL (ref 80.0–100.0)
Platelets: 196 K/uL (ref 150–400)
RBC: 4.36 MIL/uL (ref 3.87–5.11)
RDW: 14 % (ref 11.5–15.5)
WBC: 4.9 K/uL (ref 4.0–10.5)
nRBC: 0 % (ref 0.0–0.2)

## 2024-03-23 LAB — CBG MONITORING, ED: Glucose-Capillary: 102 mg/dL — ABNORMAL HIGH (ref 70–99)

## 2024-03-23 NOTE — Discharge Instructions (Signed)
 You were seen in the Emergency Department for confusion and weakness Your blood work EKG and urine test all looked okay No evidence of low sodium or urinary tract infection Follow-up with your doctor continue taking all previous prescribed medications

## 2024-03-23 NOTE — ED Notes (Signed)
 ED Provider at bedside.

## 2024-03-23 NOTE — ED Notes (Addendum)
 Reviewed discharge instructions and follow-up care with pt and daughter. Both verbalized understanding and had no further questions. Pt exited ED without complications.

## 2024-03-23 NOTE — ED Triage Notes (Signed)
 Increasing confusion x24 hrs. Jittery, weak, decreased appetite.  HX UTI, hyponatremia. Denies pain, N/V/D.

## 2024-03-23 NOTE — ED Provider Notes (Signed)
 " [place] EMERGENCY DEPARTMENT AT [facility] Provider Note   CSN: [id number] Arrival date & time: 03/23/24  1622     Patient presents with: No chief complaint on file.   Sandra Yang is a 89 y.o. female.  With a history of hypertension hyperlipidemia hyponatremia who presents to the ED for reported confusion.  Patient and her daughter voiced concern for global confusion and increased anxiety over the last 1 to 2 days.  The patient feels shaky and weak with a decreased appetite.  She called her primary care doctor office about this who directed her here for further evaluation given concern for recurrent hyponatremia which she was seen here for recently.  Also concern for potential UTI as cause of her confusion.  No nausea vomiting diarrhea respiratory symptoms fevers or chills.  No recent falls   HPI     Prior to Admission medications  Medication Sig Start Date End Date Taking? Authorizing Provider  amLODipine  (NORVASC ) 5 MG tablet Take 5 mg by mouth daily.    [provider]  cyanocobalamin  (VITAMIN B12) 1000 MCG/ML injection Inject 1 mL (1,000 mcg total) into the muscle every 14 (fourteen) days for 365 doses. 09/28/23 09/11/37  Suzann Inocente HERO, MD  Ergocalciferol  10 MCG (400 UNIT) TABS Take 1 tablet by mouth daily. 10/28/16   [provider]  hydrALAZINE  (APRESOLINE ) 50 MG tablet Take 50 mg by mouth 3 (three) times daily. 12/13/23   [provider]  mesalamine  (LIALDA ) 1.2 g EC tablet Take 2 tablets (2.4 g total) by mouth in the morning and at bedtime. 12/20/23   Suzann Inocente HERO, MD  MULTIPLE VITAMIN PO Take 1 tablet by mouth daily.    [provider]  omeprazole  (PRILOSEC) 20 MG capsule TAKE ONE CAPSULE BY MOUTH ONE TIME DAILY 03/31/23   Aneita Gwendlyn DASEN, MD  telmisartan (MICARDIS) 80 MG tablet Take 80 mg by mouth daily.    [provider]    Allergies: Metronidazole , Atorvastatin, Azithromycin, Doxycycline, Erythromycin,  Raloxifene, Tetanus immune globulin, Tetanus toxoid-containing vaccines, Tetanus-diphtheria toxoids td, and Tape    Review of Systems  Updated Vital Signs BP (!) 149/92   Pulse 79   Temp 97.8 F (36.6 C)   Resp 18   SpO2 95%   Physical Exam Vitals and nursing note reviewed.  HENT:     Head: Normocephalic and atraumatic.  Eyes:     Pupils: Pupils are equal, round, and reactive to light.  Cardiovascular:     Rate and Rhythm: Normal rate and regular rhythm.  Pulmonary:     Effort: Pulmonary effort is normal.     Breath sounds: Normal breath sounds.  Abdominal:     Palpations: Abdomen is soft.     Tenderness: There is no abdominal tenderness.  Skin:    General: Skin is warm and dry.  Neurological:     General: No focal deficit present.     Mental Status: She is alert and oriented to person, place, and time.     Sensory: No sensory deficit.     Motor: No weakness.  Psychiatric:        Mood and Affect: Mood normal.     (all labs ordered are listed, but only abnormal results are displayed) Labs Reviewed  COMPREHENSIVE METABOLIC PANEL WITH GFR - Abnormal; Notable for the following components:      Result Value   GFR, Estimated 60 (*)    All other components within normal limits  URINALYSIS, ROUTINE  W REFLEX MICROSCOPIC - Abnormal; Notable for the following components:   Leukocytes,Ua SMALL (*)    All other components within normal limits  CBG MONITORING, ED - Abnormal; Notable for the following components:   Glucose-Capillary 102 (*)    All other components within normal limits  CBC  CBG MONITORING, ED    EKG: EKG Interpretation Date/Time:  Friday March 23 2024 16:52:35 EST Ventricular Rate:  74 PR Interval:  172 QRS Duration:  62 QT Interval:  370 QTC Calculation: 410 R Axis:   84  Text Interpretation: Normal sinus rhythm Low voltage QRS Borderline ECG When compared with ECG of 30-Dec-2023 17:04, PREVIOUS ECG IS PRESENT Confirmed by Pamella Sharper [phone])  on 03/23/2024 7:05:49 PM  Radiology: No results found.   Procedures   Medications Ordered in the ED - No data to display  Clinical Course as of 03/23/24 2006  Fri Mar 23, 2024  2005 No dysrhythmia on EKG.  No recurrent hyponatremia.  Sodium of 140.  No UTI.  The rest of her labs look okay.  Patient has remained stable here.  May be related to anxiety which she will follow-up with her PCP about.  Return precautions discussed with her and her daughter in detail. [MP]    Clinical Course User Index [MP] Pamella Sharper LABOR, DO                                 Medical Decision Making 89 year old female with history as above presenting for global confusion and weakness.  Feels shaky and very anxious.  Daughter voiced concern for anxiety.  Differential diagnosis would include dehydration recurrent hyponatremia UTI anemia or other metabolic disturbance.  No focal neurologic deficits.  Low suspicion for TIA or stroke.  Was seen here recently for hypertensive emergency blood pressure a bit high but unremarkable in general.  Amount and/or Complexity of Data Reviewed Labs: ordered.        Final diagnoses:  Confusion  Weakness    ED Discharge Orders     None          Pamella Sharper LABOR, DO 03/23/24 2006  "

## 2024-03-27 DIAGNOSIS — R410 Disorientation, unspecified: Secondary | ICD-10-CM | POA: Diagnosis not present

## 2024-03-27 DIAGNOSIS — N390 Urinary tract infection, site not specified: Secondary | ICD-10-CM | POA: Diagnosis not present

## 2024-03-27 DIAGNOSIS — R002 Palpitations: Secondary | ICD-10-CM | POA: Diagnosis not present

## 2024-03-28 ENCOUNTER — Ambulatory Visit: Attending: Cardiology | Admitting: Emergency Medicine

## 2024-03-28 ENCOUNTER — Encounter: Payer: Self-pay | Admitting: Emergency Medicine

## 2024-03-28 VITALS — BP 136/50 | HR 68 | Ht 59.0 in | Wt 115.0 lb

## 2024-03-28 DIAGNOSIS — R002 Palpitations: Secondary | ICD-10-CM | POA: Diagnosis not present

## 2024-03-28 DIAGNOSIS — I10 Essential (primary) hypertension: Secondary | ICD-10-CM

## 2024-03-28 NOTE — Progress Notes (Signed)
 " Cardiology Office Note:    Date:  03/28/2024  ID:  Sandra Yang, DOB [birth year], MRN [id number] PCP: Yolande Toribio MATSU, MD  [facility] Providers Cardiologist:  Newman JINNY Lawrence, MD       Patient Profile:       Chief Complaint: 6-week follow-up History of Present Illness:  Sandra Yang is a 89 y.o. female with visit-pertinent history of hypertension, hyperlipidemia, Crohn's disease  Patient established with cardiology service on 02/08/2024 with Dr. Lawrence for evaluation of blood pressure management.  Her blood pressure fluctuates with readings typically in the 130s to 140s with occasionally reaching 225/120.  Her regimen included telmisartan 80 mg in the morning, amlodipine  5 mg at night, and hydralazine  50 mg 3 times daily.  Previously on hydrochlorothiazide  which caused significant swelling and confusion leading to hyponatremia and hospitalization.  Her edema was likely due to high salt intake and amlodipine .  proBNP was ordered which was unremarkable.  Patient underwent echocardiogram on 03/21/2024 showing LVEF 60 to 65%, no RWMA, indeterminate diastolic parameters, RV function and size normal, normal PASP, left atrial size mildly dilated, mild mitral valve regurgitation.  Zio 02/08/2024 showed average heart rate 70 bpm, 16 supraventricular tachycardia runs occurred, the run with the fastest lasting 10 beats with a max rate of 188 bpm, the longest lasting 12.4 seconds with an average rate of 111 bpm.  Rare PACs and PVCs.   Discussed the use of AI scribe software for clinical note transcription with the patient, who gave verbal consent to proceed.  History of Present Illness Sandra Yang is an 89 year old female with hypertension who presents for evaluation of blood pressure management.   She has long-standing hypertension that has been difficult to control on amlodipine , hydralazine , and telmisartan. Office blood pressure was previously 172/60 mmHg. At home,  readings range in the 140s/70s with a recent value in the 130s.  Per PCP mention starting metoprolol  but she did not start metoprolol  because of concern that adding another medication could worsen confusion.  She has had intermittent confusion, prompting evaluation by another physician. Her daughter now stays with her and organizes her medications to prevent missed or extra doses. A recent urinary tract infection was treated and her confusion improved, but medication management remains a concern.  Daughter was concerned patient was not taking her medications as prescribed.  She has occasional palpitations described as an increased awareness of her heartbeat without clear skipped beats.  She reports no increase in palpitations or no feelings of heart racing.  She has intermittent leg swelling that improves with elevation and compression stockings. Her daughter observed marked edema after she sat up all night, which resolved by the next morning.  She has an active social life and drinks wine occasionally, but she stopped when her blood pressure issues began.  She denies chest pains, dyspnea, orthopnea, PND, lightheadedness, dizziness, syncope, or presyncope   Review of systems:  Please see the history of present illness. All other systems are reviewed and otherwise negative.      Studies Reviewed:        ZIO 02/08/2024 Patch Wear Time:  14 days and 0 hours (2025-11-05T16:08:50-0500 to 2025-11-19T16:08:50-0500)   Patient had a min HR of 46 bpm, max HR of 188 bpm, and avg HR of 70 bpm. Predominant underlying rhythm was Sinus Rhythm. Slight P wave morphology changes were noted. 16 Supraventricular Tachycardia runs occurred, the run with the fastest interval lasting  10 beats with a max  rate of 188 bpm, the longest lasting 12.4 secs with an avg rate of 111 bpm. Isolated SVEs were rare (<1.0%), SVE Couplets were rare (<1.0%), and SVE Triplets were rare (<1.0%). Isolated VEs were rare (<1.0%), and no  VE Couplets or VE  Triplets were present.  Echocardiogram 03/21/2024 1. Left ventricular ejection fraction, by estimation, is 60 to 65%. Left  ventricular ejection fraction by 3D volume is 63 %. The left ventricle has  normal function. The left ventricle has no regional wall motion  abnormalities. Left ventricular diastolic   parameters are indeterminate. The average left ventricular global  longitudinal strain is -21.0 %. The global longitudinal strain is normal.   2. Right ventricular systolic function is normal. The right ventricular  size is normal. There is normal pulmonary artery systolic pressure. The  estimated right ventricular systolic pressure is 29.2 mmHg.   3. Left atrial size was mildly dilated.   4. The mitral valve is normal in structure. Mild mitral valve  regurgitation. No evidence of mitral stenosis.   5. The aortic valve is tricuspid. Aortic valve regurgitation is not  visualized. Aortic valve sclerosis is present, with no evidence of aortic  valve stenosis.   6. The inferior vena cava is normal in size with greater than 50%  respiratory variability, suggesting right atrial pressure of 3 mmHg.   Risk Assessment/Calculations:              Physical Exam:   VS:  BP (!) 136/50 (BP Location: Left Arm, Patient Position: Sitting, Cuff Size: Normal)   Pulse 68   Ht 4' 11 (1.499 m)   Wt 115 lb (52.2 kg)   BMI 23.23 kg/m    Wt Readings from Last 3 Encounters:  03/28/24 115 lb (52.2 kg)  02/08/24 116 lb (52.6 kg)  12/14/23 119 lb (54 kg)    GEN: Well nourished, well developed in no acute distress NECK: No JVD; No carotid bruits CARDIAC: RRR, no murmurs, rubs, gallops RESPIRATORY:  Clear to auscultation without rales, wheezing or rhonchi  ABDOMEN: Soft, non-tender, non-distended EXTREMITIES:  No edema; No acute deformity      Assessment and Plan:  Hypertension Blood pressure today is well-controlled at 136/50 Recent echo and renal ultrasound were  reassuring Daughter reports initial concern that patient was not taking medications as prescribed and missing doses due to some baseline confusion possibly leading to elevated blood pressure readings.  Daughter now helps manage medications and blood pressure seems much better controlled - Past intolerances to hydrochlorothiazide  (hyponatremia) and metoprolol  (bradycardia) - No changes to current medication regimen - Continue amlodipine  5 mg at nighttime, telmisartan 80 mg in the morning, and hydralazine  50 mg 3 times daily - Can take extra as needed hydralazine  for blood pressure greater than 170  Mild left renal artery stenosis Mild stenosis of the left renal artery noted on previous imaging.  This was not considered to be significantly abnormal and thought to be unlikely as the primary cause of hypertension - No further interventions warranted at this time   Palpitations Zio 02/2024 with 16 SVT runs with the longest lasting 12.4 seconds and average rate of 111 bpm and rare PACs and PVCs.  No arrhythmias, blocks, pauses - Zio was reassuring.  Patient notes improvement in palpitations.  Relates her symptoms more to anxiety - No changes to current regimen.  Would not use beta-blocking therapy as patient has experienced bradycardia in the past metoprolol  - Continue clinically monitor      Dispo:  Return in about 8 weeks (around 05/23/2024).  Signed, Lum LITTIE Louis, NP  "

## 2024-03-28 NOTE — Patient Instructions (Signed)
 Medication Instructions:  NO CHANGES  Lab Work: NONE TO BE DONE TODAY.  Testing/Procedures: NONE  Follow-Up: At [facility], you and your health needs are our priority.  As part of our continuing mission to provide you with exceptional heart care, our providers are all part of one team.  This team includes your primary Cardiologist (physician) and Advanced Practice Providers or APPs (Physician Assistants and Nurse Practitioners) who all work together to provide you with the care you need, when you need it.  Your next appointment:   8 WEEKS  Provider:   MADISON FOUNTAIN, NP

## 2024-03-29 DIAGNOSIS — R002 Palpitations: Secondary | ICD-10-CM

## 2024-03-29 NOTE — Progress Notes (Signed)
 You already addressed this in recent office visit. Thank you. Seeing you again in 05/2024.  Thanks MJP

## 2024-04-03 ENCOUNTER — Other Ambulatory Visit: Payer: Self-pay

## 2024-04-03 MED ORDER — OMEPRAZOLE 20 MG PO CPDR: 20.0000 mg | DELAYED_RELEASE_CAPSULE | Freq: Every day | ORAL | 3 refills | Status: AC

## 2024-04-03 NOTE — Progress Notes (Signed)
 Rx refill request received by fax from [facility].

## 2024-04-09 ENCOUNTER — Other Ambulatory Visit (HOSPITAL_COMMUNITY): Payer: Self-pay | Admitting: Family Medicine

## 2024-04-09 DIAGNOSIS — R410 Disorientation, unspecified: Secondary | ICD-10-CM

## 2024-04-12 ENCOUNTER — Ambulatory Visit (HOSPITAL_COMMUNITY)
Admission: RE | Admit: 2024-04-12 | Discharge: 2024-04-12 | Disposition: A | Discharge status: Home / Self Care | Source: Ambulatory Visit | Attending: Family Medicine | Admitting: Family Medicine

## 2024-04-12 DIAGNOSIS — R410 Disorientation, unspecified: Secondary | ICD-10-CM | POA: Insufficient documentation

## 2024-04-12 MED ORDER — GADOBUTROL 1 MMOL/ML IV SOLN
5.0000 mL | Freq: Once | INTRAVENOUS | Status: AC | PRN
  Administered 2024-04-12: 5 mL via INTRAVENOUS

## 2024-05-23 ENCOUNTER — Ambulatory Visit: Admitting: Emergency Medicine
